# Patient Record
Sex: Male | Born: 1985 | Race: Black or African American | Hispanic: No | Marital: Single
Health system: Southern US, Community
[De-identification: ages and names within clinical notes are randomized; demographics above are authoritative.]

## PROBLEM LIST (undated history)

## (undated) DIAGNOSIS — Z789 Other specified health status: Secondary | ICD-10-CM

## (undated) DIAGNOSIS — T07XXXA Unspecified multiple injuries, initial encounter: Secondary | ICD-10-CM

## (undated) DIAGNOSIS — S02609A Fracture of mandible, unspecified, initial encounter for closed fracture: Secondary | ICD-10-CM

## (undated) DIAGNOSIS — W3400XA Accidental discharge from unspecified firearms or gun, initial encounter: Secondary | ICD-10-CM

## (undated) HISTORY — PX: NECK SURGERY: SHX720

---
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
## 2004-05-16 HISTORY — PX: OTHER SURGICAL HISTORY: SHX169

## 2004-05-31 ENCOUNTER — Emergency Department (HOSPITAL_COMMUNITY): Admission: EM | Admit: 2004-05-31 | Discharge: 2004-05-31 | Payer: Self-pay | Admitting: Emergency Medicine

## 2004-06-26 ENCOUNTER — Inpatient Hospital Stay (HOSPITAL_COMMUNITY): Admission: AC | Admit: 2004-06-26 | Discharge: 2004-07-01 | Payer: Self-pay

## 2004-08-19 ENCOUNTER — Emergency Department (HOSPITAL_COMMUNITY): Admission: EM | Admit: 2004-08-19 | Discharge: 2004-08-19 | Payer: Self-pay | Admitting: Emergency Medicine

## 2004-09-04 ENCOUNTER — Emergency Department (HOSPITAL_COMMUNITY): Admission: EM | Admit: 2004-09-04 | Discharge: 2004-09-04 | Payer: Self-pay | Admitting: Emergency Medicine

## 2006-11-13 ENCOUNTER — Emergency Department (HOSPITAL_COMMUNITY): Admission: EM | Admit: 2006-11-13 | Discharge: 2006-11-13 | Payer: Self-pay | Admitting: Emergency Medicine

## 2007-01-27 ENCOUNTER — Emergency Department (HOSPITAL_COMMUNITY): Admission: EM | Admit: 2007-01-27 | Discharge: 2007-01-27 | Payer: Self-pay | Admitting: Emergency Medicine

## 2007-08-26 ENCOUNTER — Emergency Department (HOSPITAL_COMMUNITY): Admission: EM | Admit: 2007-08-26 | Discharge: 2007-08-26 | Payer: Self-pay | Admitting: Emergency Medicine

## 2008-01-02 ENCOUNTER — Emergency Department (HOSPITAL_COMMUNITY): Admission: EM | Admit: 2008-01-02 | Discharge: 2008-01-02 | Payer: Self-pay | Admitting: Emergency Medicine

## 2008-09-18 ENCOUNTER — Inpatient Hospital Stay (HOSPITAL_COMMUNITY): Admission: AC | Admit: 2008-09-18 | Discharge: 2008-09-18 | Payer: Self-pay

## 2010-08-24 LAB — TYPE AND SCREEN: ABO/RH(D): A POS

## 2010-08-24 LAB — ABO/RH: ABO/RH(D): A POS
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
## 2011-02-15 ENCOUNTER — Emergency Department (HOSPITAL_COMMUNITY): Payer: Self-pay

## 2011-02-15 ENCOUNTER — Emergency Department (HOSPITAL_COMMUNITY)
Admission: EM | Admit: 2011-02-15 | Discharge: 2011-02-15 | Disposition: A | Discharge status: Home / Self Care | Payer: Self-pay | Attending: Emergency Medicine | Admitting: Emergency Medicine

## 2011-02-15 DIAGNOSIS — J3489 Other specified disorders of nose and nasal sinuses: Secondary | ICD-10-CM | POA: Insufficient documentation

## 2011-02-15 DIAGNOSIS — R059 Cough, unspecified: Secondary | ICD-10-CM | POA: Insufficient documentation

## 2011-02-15 DIAGNOSIS — R05 Cough: Secondary | ICD-10-CM | POA: Insufficient documentation

## 2011-02-15 DIAGNOSIS — R61 Generalized hyperhidrosis: Secondary | ICD-10-CM | POA: Insufficient documentation

## 2011-02-15 DIAGNOSIS — J40 Bronchitis, not specified as acute or chronic: Secondary | ICD-10-CM | POA: Insufficient documentation

## 2011-06-23 ENCOUNTER — Emergency Department (HOSPITAL_COMMUNITY)
Admission: EM | Admit: 2011-06-23 | Discharge: 2011-06-24 | Disposition: A | Discharge status: Home / Self Care | Payer: Self-pay | Attending: Emergency Medicine | Admitting: Emergency Medicine

## 2011-06-23 DIAGNOSIS — M79676 Pain in unspecified toe(s): Secondary | ICD-10-CM

## 2011-06-23 DIAGNOSIS — B351 Tinea unguium: Secondary | ICD-10-CM | POA: Insufficient documentation

## 2011-06-23 DIAGNOSIS — M79609 Pain in unspecified limb: Secondary | ICD-10-CM | POA: Insufficient documentation

## 2011-06-23 HISTORY — DX: Unspecified multiple injuries, initial encounter: T07.XXXA

## 2011-06-23 HISTORY — DX: Accidental discharge from unspecified firearms or gun, initial encounter: W34.00XA

## 2011-06-24 ENCOUNTER — Encounter (HOSPITAL_COMMUNITY): Payer: Self-pay | Admitting: *Deleted

## 2011-06-24 MED ORDER — TERBINAFINE HCL 250 MG PO TABS: 250.0000 mg | ORAL_TABLET | Freq: Every day | ORAL | Status: DC

## 2011-06-24 NOTE — ED Notes (Signed)
Pt c/o pain around nail of left great toe.  Nail is starting to come off.

## 2011-06-24 NOTE — ED Provider Notes (Signed)
Medical screening examination/treatment/procedure(s) were performed by non-physician practitioner and as supervising physician I was immediately available for consultation/collaboration.  Nicholes Stairs, MD 06/24/11 [phone]
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
## 2011-06-24 NOTE — ED Notes (Signed)
C/o L 1st toe pain, onset last night, worse today, r/t toe nail coming off, mentions toe nail fungus. Denies fever or other sx.

## 2012-01-21 ENCOUNTER — Encounter (HOSPITAL_COMMUNITY): Payer: Self-pay | Admitting: Physical Medicine and Rehabilitation

## 2012-01-21 ENCOUNTER — Emergency Department (HOSPITAL_COMMUNITY)
Admission: EM | Admit: 2012-01-21 | Discharge: 2012-01-21 | Disposition: A | Discharge status: Home / Self Care | Payer: Self-pay | Attending: Emergency Medicine | Admitting: Emergency Medicine

## 2012-01-21 DIAGNOSIS — L0201 Cutaneous abscess of face: Secondary | ICD-10-CM | POA: Insufficient documentation

## 2012-01-21 DIAGNOSIS — L03211 Cellulitis of face: Secondary | ICD-10-CM | POA: Insufficient documentation

## 2012-01-21 DIAGNOSIS — F17200 Nicotine dependence, unspecified, uncomplicated: Secondary | ICD-10-CM | POA: Insufficient documentation

## 2012-01-21 MED ORDER — SULFAMETHOXAZOLE-TRIMETHOPRIM 800-160 MG PO TABS: 1.0000 | ORAL_TABLET | Freq: Two times a day (BID) | ORAL | Status: AC

## 2012-01-21 MED ORDER — HYDROCODONE-ACETAMINOPHEN 5-500 MG PO TABS: 1.0000 | ORAL_TABLET | Freq: Four times a day (QID) | ORAL | Status: AC | PRN

## 2012-01-21 MED ORDER — CEPHALEXIN 500 MG PO CAPS: 500.0000 mg | ORAL_CAPSULE | Freq: Four times a day (QID) | ORAL | Status: AC
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
## 2012-01-21 NOTE — ED Notes (Signed)
Pt presents to department for evaluation of L sided facial abscess. Ongoing x4 days. Pt states area is raised, swollen and painful to touch. White colored drainage noted at site. 8/10 pain at the time. No signs of distress noted.

## 2012-04-30 ENCOUNTER — Emergency Department (HOSPITAL_COMMUNITY)
Admission: EM | Admit: 2012-04-30 | Discharge: 2012-04-30 | Disposition: A | Discharge status: Home / Self Care | Payer: Self-pay | Attending: Emergency Medicine | Admitting: Emergency Medicine

## 2012-04-30 ENCOUNTER — Encounter (HOSPITAL_COMMUNITY): Payer: Self-pay | Admitting: Emergency Medicine

## 2012-04-30 DIAGNOSIS — N498 Inflammatory disorders of other specified male genital organs: Secondary | ICD-10-CM | POA: Insufficient documentation

## 2012-04-30 DIAGNOSIS — Z87828 Personal history of other (healed) physical injury and trauma: Secondary | ICD-10-CM | POA: Insufficient documentation

## 2012-04-30 DIAGNOSIS — F17200 Nicotine dependence, unspecified, uncomplicated: Secondary | ICD-10-CM | POA: Insufficient documentation

## 2012-04-30 DIAGNOSIS — L0291 Cutaneous abscess, unspecified: Secondary | ICD-10-CM

## 2012-04-30 MED ORDER — HYDROCODONE-ACETAMINOPHEN 5-325 MG PO TABS: 1.0000 | ORAL_TABLET | ORAL | Status: DC | PRN

## 2012-04-30 MED ORDER — SULFAMETHOXAZOLE-TRIMETHOPRIM 800-160 MG PO TABS: 1.0000 | ORAL_TABLET | Freq: Two times a day (BID) | ORAL | Status: DC

## 2012-04-30 MED ORDER — CEPHALEXIN 500 MG PO CAPS: 500.0000 mg | ORAL_CAPSULE | Freq: Four times a day (QID) | ORAL | Status: DC

## 2012-04-30 MED ORDER — OXYCODONE-ACETAMINOPHEN 5-325 MG PO TABS
1.0000 | ORAL_TABLET | Freq: Once | ORAL | Status: AC
  Administered 2012-04-30: 1 via ORAL
  Filled 2012-04-30: qty 1

## 2012-04-30 NOTE — ED Notes (Signed)
Patient states that he has an painful groin, patient states there is a knot there.  Ibuprofen not helping with pain.

## 2012-04-30 NOTE — ED Provider Notes (Signed)
  Medical screening examination/treatment/procedure(s) were performed by non-physician practitioner and as supervising physician I was immediately available for consultation/collaboration.    Georganna Maxson D Shakira Los, MD 04/30/12 0719 
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
## 2012-05-19 ENCOUNTER — Emergency Department (HOSPITAL_COMMUNITY)
Admission: EM | Admit: 2012-05-19 | Discharge: 2012-05-19 | Disposition: A | Discharge status: Home / Self Care | Payer: Self-pay | Attending: Emergency Medicine | Admitting: Emergency Medicine

## 2012-05-19 ENCOUNTER — Encounter (HOSPITAL_COMMUNITY): Payer: Self-pay | Admitting: *Deleted

## 2012-05-19 DIAGNOSIS — R059 Cough, unspecified: Secondary | ICD-10-CM | POA: Insufficient documentation

## 2012-05-19 DIAGNOSIS — IMO0001 Reserved for inherently not codable concepts without codable children: Secondary | ICD-10-CM | POA: Insufficient documentation

## 2012-05-19 DIAGNOSIS — F17200 Nicotine dependence, unspecified, uncomplicated: Secondary | ICD-10-CM | POA: Insufficient documentation

## 2012-05-19 DIAGNOSIS — R05 Cough: Secondary | ICD-10-CM | POA: Insufficient documentation

## 2012-05-19 DIAGNOSIS — R110 Nausea: Secondary | ICD-10-CM | POA: Insufficient documentation

## 2012-05-19 DIAGNOSIS — B9789 Other viral agents as the cause of diseases classified elsewhere: Secondary | ICD-10-CM | POA: Insufficient documentation

## 2012-05-19 DIAGNOSIS — B349 Viral infection, unspecified: Secondary | ICD-10-CM

## 2012-05-19 DIAGNOSIS — R197 Diarrhea, unspecified: Secondary | ICD-10-CM | POA: Insufficient documentation

## 2012-05-19 DIAGNOSIS — J029 Acute pharyngitis, unspecified: Secondary | ICD-10-CM | POA: Insufficient documentation

## 2012-05-19 MED ORDER — ACETAMINOPHEN 325 MG PO TABS
650.0000 mg | ORAL_TABLET | Freq: Four times a day (QID) | ORAL | Status: DC | PRN
  Administered 2012-05-19: 650 mg via ORAL
  Filled 2012-05-19 (×2): qty 2

## 2012-05-19 MED ORDER — SODIUM CHLORIDE 0.9 % IV BOLUS (SEPSIS)
1000.0000 mL | Freq: Once | INTRAVENOUS | Status: AC
  Administered 2012-05-19: 1000 mL via INTRAVENOUS

## 2012-05-19 MED ORDER — HYDROCOD POLST-CHLORPHEN POLST 10-8 MG/5ML PO LQCR
5.0000 mL | Freq: Once | ORAL | Status: AC
  Administered 2012-05-19: 5 mL via ORAL
  Filled 2012-05-19: qty 5

## 2012-05-19 MED ORDER — OSELTAMIVIR PHOSPHATE 75 MG PO CAPS: 75.0000 mg | ORAL_CAPSULE | Freq: Two times a day (BID) | ORAL | Status: DC

## 2012-05-19 MED ORDER — ONDANSETRON HCL 4 MG/2ML IJ SOLN
4.0000 mg | Freq: Once | INTRAMUSCULAR | Status: AC
  Administered 2012-05-19: 4 mg via INTRAVENOUS
  Filled 2012-05-19: qty 2

## 2012-05-19 MED ORDER — HYDROCOD POLST-CHLORPHEN POLST 10-8 MG/5ML PO LQCR: 5.0000 mL | Freq: Two times a day (BID) | ORAL | Status: DC

## 2012-05-19 NOTE — ED Notes (Signed)
PT A.O. X 4. Reports cough x 6 days. Generalized body aches, fever at home greater than 103, and Nausea. Reports feeling hot. Skin warm and dry. NAD.  Denies SOB. Denies. Ambulatory with no assistance. Family at bedside.

## 2012-05-19 NOTE — ED Notes (Signed)
Pt ambulatory to restroom

## 2012-05-19 NOTE — ED Provider Notes (Signed)
Medical screening examination/treatment/procedure(s) were performed by non-physician practitioner and as supervising physician I was immediately available for consultation/collaboration.   Lyanne Co, MD 05/19/12 [phone]

## 2012-05-19 NOTE — ED Notes (Signed)
Pt c/o fever, cough, diarrhea, and body aches since last night.
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
## 2012-05-19 NOTE — ED Notes (Signed)
Pt A.O. X 4. Reports general body aches 3/10. Non productive cough present. Vitals stable. Verbalized importance of medication admin. Verbalized need to follow up with PCP. Verbalized need to seek immediate treatment. Wife at bedside. Ambulatory with no assistance. No further needs at this time.

## 2013-01-09 ENCOUNTER — Emergency Department (HOSPITAL_COMMUNITY): Payer: Self-pay

## 2013-01-09 ENCOUNTER — Encounter (HOSPITAL_COMMUNITY): Payer: Self-pay | Admitting: *Deleted

## 2013-01-09 ENCOUNTER — Emergency Department (HOSPITAL_COMMUNITY)
Admission: EM | Admit: 2013-01-09 | Discharge: 2013-01-09 | Disposition: A | Discharge status: Home / Self Care | Payer: Self-pay | Attending: Emergency Medicine | Admitting: Emergency Medicine

## 2013-01-09 DIAGNOSIS — Z87828 Personal history of other (healed) physical injury and trauma: Secondary | ICD-10-CM | POA: Insufficient documentation

## 2013-01-09 DIAGNOSIS — N498 Inflammatory disorders of other specified male genital organs: Secondary | ICD-10-CM | POA: Insufficient documentation

## 2013-01-09 DIAGNOSIS — Z202 Contact with and (suspected) exposure to infections with a predominantly sexual mode of transmission: Secondary | ICD-10-CM | POA: Insufficient documentation

## 2013-01-09 DIAGNOSIS — N492 Inflammatory disorders of scrotum: Secondary | ICD-10-CM

## 2013-01-09 DIAGNOSIS — F17200 Nicotine dependence, unspecified, uncomplicated: Secondary | ICD-10-CM | POA: Insufficient documentation

## 2013-01-09 DIAGNOSIS — Z711 Person with feared health complaint in whom no diagnosis is made: Secondary | ICD-10-CM

## 2013-01-09 LAB — URINALYSIS, ROUTINE W REFLEX MICROSCOPIC
Glucose, UA: NEGATIVE mg/dL
Ketones, ur: NEGATIVE mg/dL
Leukocytes, UA: NEGATIVE
pH: 5.5 (ref 5.0–8.0)

## 2013-01-09 LAB — POCT I-STAT, CHEM 8
Chloride: 102 mEq/L (ref 96–112)
Creatinine, Ser: 1 mg/dL (ref 0.50–1.35)
Glucose, Bld: 77 mg/dL (ref 70–99)
Potassium: 3.5 mEq/L (ref 3.5–5.1)

## 2013-01-09 LAB — CBC
HCT: 42 % (ref 39.0–52.0)
Hemoglobin: 14.9 g/dL (ref 13.0–17.0)
WBC: 7.9 10*3/uL (ref 4.0–10.5)

## 2013-01-09 MED ORDER — CLINDAMYCIN HCL 150 MG PO CAPS: 450.0000 mg | ORAL_CAPSULE | Freq: Three times a day (TID) | ORAL | Status: DC

## 2013-01-09 MED ORDER — SODIUM CHLORIDE 0.9 % IV SOLN
Freq: Once | INTRAVENOUS | Status: AC
  Administered 2013-01-09: 20:00:00 via INTRAVENOUS

## 2013-01-09 MED ORDER — LIDOCAINE HCL (PF) 1 % IJ SOLN
INTRAMUSCULAR | Status: AC
  Administered 2013-01-09: 2 mL
  Filled 2013-01-09: qty 5

## 2013-01-09 MED ORDER — CLINDAMYCIN PHOSPHATE 600 MG/50ML IV SOLN
600.0000 mg | Freq: Once | INTRAVENOUS | Status: AC
  Administered 2013-01-09: 600 mg via INTRAVENOUS
  Filled 2013-01-09: qty 50

## 2013-01-09 MED ORDER — CEFTRIAXONE SODIUM 250 MG IJ SOLR
250.0000 mg | Freq: Once | INTRAMUSCULAR | Status: AC
  Administered 2013-01-09: 250 mg via INTRAMUSCULAR
  Filled 2013-01-09: qty 250

## 2013-01-09 MED ORDER — AZITHROMYCIN 250 MG PO TABS
1000.0000 mg | ORAL_TABLET | Freq: Once | ORAL | Status: AC
  Administered 2013-01-09: 1000 mg via ORAL
  Filled 2013-01-09: qty 4
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
## 2013-01-09 NOTE — ED Notes (Signed)
Pt in requesting STD check due to partner reporting symptoms, pt denies symptoms

## 2013-01-10 LAB — RPR: RPR Ser Ql: NONREACTIVE

## 2013-01-10 NOTE — ED Provider Notes (Signed)
Medical screening examination/treatment/procedure(s) were performed by non-physician practitioner and as supervising physician I was immediately available for consultation/collaboration.  Shon Baton, MD 01/10/13 [phone]

## 2013-03-13 ENCOUNTER — Encounter (HOSPITAL_COMMUNITY): Payer: Self-pay | Admitting: Emergency Medicine

## 2013-03-13 ENCOUNTER — Emergency Department (HOSPITAL_COMMUNITY): Payer: Self-pay

## 2013-03-13 ENCOUNTER — Emergency Department (HOSPITAL_COMMUNITY)
Admission: EM | Admit: 2013-03-13 | Discharge: 2013-03-13 | Disposition: A | Discharge status: Home / Self Care | Payer: Self-pay | Attending: Emergency Medicine | Admitting: Emergency Medicine

## 2013-03-13 DIAGNOSIS — F17200 Nicotine dependence, unspecified, uncomplicated: Secondary | ICD-10-CM | POA: Insufficient documentation

## 2013-03-13 DIAGNOSIS — S02600B Fracture of unspecified part of body of mandible, initial encounter for open fracture: Secondary | ICD-10-CM

## 2013-03-13 DIAGNOSIS — S02650B Fracture of angle of mandible, unspecified side, initial encounter for open fracture: Secondary | ICD-10-CM | POA: Insufficient documentation

## 2013-03-13 DIAGNOSIS — S0280XA Fracture of other specified skull and facial bones, unspecified side, initial encounter for closed fracture: Secondary | ICD-10-CM | POA: Insufficient documentation

## 2013-03-13 DIAGNOSIS — Z23 Encounter for immunization: Secondary | ICD-10-CM | POA: Insufficient documentation

## 2013-03-13 DIAGNOSIS — F1010 Alcohol abuse, uncomplicated: Secondary | ICD-10-CM | POA: Insufficient documentation

## 2013-03-13 DIAGNOSIS — S01501A Unspecified open wound of lip, initial encounter: Secondary | ICD-10-CM | POA: Insufficient documentation

## 2013-03-13 DIAGNOSIS — S02609B Fracture of mandible, unspecified, initial encounter for open fracture: Secondary | ICD-10-CM

## 2013-03-13 LAB — CBC WITH DIFFERENTIAL/PLATELET
Basophils Relative: 0 % (ref 0–1)
Eosinophils Absolute: 0 10*3/uL (ref 0.0–0.7)
HCT: 44.4 % (ref 39.0–52.0)
Hemoglobin: 15.6 g/dL (ref 13.0–17.0)
MCH: 31 pg (ref 26.0–34.0)
MCHC: 35.1 g/dL (ref 30.0–36.0)
Monocytes Absolute: 1.3 10*3/uL — ABNORMAL HIGH (ref 0.1–1.0)
Monocytes Relative: 9 % (ref 3–12)

## 2013-03-13 LAB — BASIC METABOLIC PANEL
BUN: 14 mg/dL (ref 6–23)
Chloride: 97 mEq/L (ref 96–112)
Creatinine, Ser: 0.86 mg/dL (ref 0.50–1.35)
GFR calc Af Amer: >90 mL/min (ref >90)
GFR calc non Af Amer: >90 mL/min (ref >90)

## 2013-03-13 MED ORDER — AMOXICILLIN-POT CLAVULANATE 500-125 MG PO TABS: 1.0000 | ORAL_TABLET | Freq: Three times a day (TID) | ORAL | Status: DC

## 2013-03-13 MED ORDER — SODIUM CHLORIDE 0.9 % IV SOLN
1000.0000 mL | Freq: Once | INTRAVENOUS | Status: AC
  Administered 2013-03-13: 1000 mL via INTRAVENOUS

## 2013-03-13 MED ORDER — TETANUS-DIPHTH-ACELL PERTUSSIS 5-2.5-18.5 LF-MCG/0.5 IM SUSP
0.5000 mL | Freq: Once | INTRAMUSCULAR | Status: AC
  Administered 2013-03-13: 0.5 mL via INTRAMUSCULAR
  Filled 2013-03-13: qty 0.5

## 2013-03-13 MED ORDER — HYDROMORPHONE HCL PF 1 MG/ML IJ SOLN
1.0000 mg | Freq: Once | INTRAMUSCULAR | Status: AC
  Administered 2013-03-13: 1 mg via INTRAVENOUS
  Filled 2013-03-13: qty 1

## 2013-03-13 MED ORDER — HYDROMORPHONE HCL PF 1 MG/ML IJ SOLN
1.0000 mg | Freq: Once | INTRAMUSCULAR | Status: AC
  Administered 2013-03-13: 1 mg via INTRAVENOUS

## 2013-03-13 MED ORDER — HYDROMORPHONE HCL PF 1 MG/ML IJ SOLN
INTRAMUSCULAR | Status: AC
  Filled 2013-03-13: qty 1

## 2013-03-13 MED ORDER — SODIUM CHLORIDE 0.9 % IV SOLN
3.0000 g | Freq: Once | INTRAVENOUS | Status: AC
  Administered 2013-03-13: 3 g via INTRAVENOUS
  Filled 2013-03-13: qty 3

## 2013-03-13 MED ORDER — IBUPROFEN 600 MG PO TABS
600.0000 mg | ORAL_TABLET | Freq: Three times a day (TID) | ORAL | Status: DC | PRN
Start: 2013-03-13 — End: 2013-04-28

## 2013-03-13 MED ORDER — OXYCODONE-ACETAMINOPHEN 5-325 MG PO TABS: 1.0000 | ORAL_TABLET | ORAL | Status: DC | PRN

## 2013-03-13 MED ORDER — SODIUM CHLORIDE 0.9 % IV SOLN
1000.0000 mL | INTRAVENOUS | Status: DC
  Administered 2013-03-13: 1000 mL via INTRAVENOUS

## 2013-03-13 MED ORDER — AMOXICILLIN-POT CLAVULANATE 400-57 MG/5ML PO SUSR: 400.0000 mg | Freq: Three times a day (TID) | ORAL | Status: DC

## 2013-03-13 NOTE — ED Provider Notes (Signed)
I personally saw and evaluated this patient and provided his care.  My physician assistant performed a laceration repair.  Please see documentation.  Lyanne Co, MD 03/13/13 [id number]

## 2013-03-13 NOTE — ED Provider Notes (Signed)
I was asked by Dr. Patria Mane to repair lip laceration.  LACERATION REPAIR Performed by: Roxy Horseman Authorized by: Roxy Horseman Consent: Verbal consent obtained. Risks and benefits: risks, benefits and alternatives were discussed Consent given by: patient Patient identity confirmed: provided demographic data Prepped and Draped in normal sterile fashion Wound explored  Laceration Location: left lower lip  Laceration Length: 2cm  No Foreign Bodies seen or palpated  Anesthesia: local infiltration  Local anesthetic: lidocaine 2% without epinephrine  Anesthetic total: 3 ml  Irrigation method: syringe Amount of cleaning: standard  Skin closure: 5-0 vicryl  Number of sutures: 4  Technique: interrupted  Patient tolerance: Patient tolerated the procedure well with no immediate complications.   Roxy Horseman, PA-C 03/13/13 [phone]
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
## 2013-03-13 NOTE — ED Notes (Signed)
Ice pak applied to pt.s lt. Facial area.lt. Side of face is swollen , lt. Eye is swollen shut.  Airway patent
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
## 2013-03-13 NOTE — ED Notes (Signed)
Plastic surgeon was into see pt.  Pt. Was given ice chips and mouth swabs per Surgeon.

## 2013-03-13 NOTE — ED Notes (Signed)
[place] collar placed on per order from EDP

## 2013-03-15 ENCOUNTER — Encounter (HOSPITAL_COMMUNITY): Payer: Self-pay | Admitting: Pharmacy Technician

## 2013-03-15 ENCOUNTER — Other Ambulatory Visit: Payer: Self-pay | Admitting: Plastic Surgery

## 2013-03-15 DIAGNOSIS — S02609D Fracture of mandible, unspecified, subsequent encounter for fracture with routine healing: Secondary | ICD-10-CM

## 2013-03-19 ENCOUNTER — Other Ambulatory Visit: Payer: Self-pay | Admitting: Plastic Surgery

## 2013-03-19 ENCOUNTER — Encounter (HOSPITAL_COMMUNITY): Payer: Self-pay | Admitting: *Deleted

## 2013-03-19 DIAGNOSIS — S02609D Fracture of mandible, unspecified, subsequent encounter for fracture with routine healing: Secondary | ICD-10-CM

## 2013-03-19 MED ORDER — CEFAZOLIN SODIUM-DEXTROSE 2-3 GM-% IV SOLR
2.0000 g | INTRAVENOUS | Status: AC
  Administered 2013-03-20: 2 g via INTRAVENOUS
  Filled 2013-03-19: qty 50

## 2013-03-20 ENCOUNTER — Encounter (HOSPITAL_COMMUNITY)
Admission: RE | Disposition: A | Discharge status: Home / Self Care | Payer: Self-pay | Source: Ambulatory Visit | Attending: Plastic Surgery

## 2013-03-20 ENCOUNTER — Ambulatory Visit (HOSPITAL_COMMUNITY): Payer: Self-pay | Admitting: Anesthesiology

## 2013-03-20 ENCOUNTER — Encounter (HOSPITAL_COMMUNITY): Payer: Self-pay | Admitting: Plastic Surgery

## 2013-03-20 ENCOUNTER — Ambulatory Visit (HOSPITAL_COMMUNITY)
Admission: RE | Admit: 2013-03-20 | Discharge: 2013-03-20 | Disposition: A | Discharge status: Home / Self Care | Payer: Self-pay | Source: Ambulatory Visit | Attending: Plastic Surgery | Admitting: Plastic Surgery

## 2013-03-20 ENCOUNTER — Encounter (HOSPITAL_COMMUNITY): Payer: Self-pay | Admitting: Anesthesiology

## 2013-03-20 DIAGNOSIS — S02609A Fracture of mandible, unspecified, initial encounter for closed fracture: Secondary | ICD-10-CM | POA: Insufficient documentation

## 2013-03-20 DIAGNOSIS — S02609D Fracture of mandible, unspecified, subsequent encounter for fracture with routine healing: Secondary | ICD-10-CM

## 2013-03-20 DIAGNOSIS — X58XXXA Exposure to other specified factors, initial encounter: Secondary | ICD-10-CM | POA: Insufficient documentation

## 2013-03-20 HISTORY — PX: ORIF MANDIBULAR FRACTURE: SHX2127

## 2013-03-20 SURGERY — OPEN REDUCTION INTERNAL FIXATION (ORIF) MANDIBULAR FRACTURE
Anesthesia: General | Site: Face | Laterality: Bilateral | Wound class: Clean

## 2013-03-20 MED ORDER — LIDOCAINE-EPINEPHRINE 1 %-1:100000 IJ SOLN
INTRAMUSCULAR | Status: DC | PRN
  Administered 2013-03-20: 9 mL

## 2013-03-20 MED ORDER — SUCCINYLCHOLINE CHLORIDE 20 MG/ML IJ SOLN
INTRAMUSCULAR | Status: DC | PRN
  Administered 2013-03-20: 140 mg via INTRAVENOUS

## 2013-03-20 MED ORDER — CHLORHEXIDINE GLUCONATE 0.12 % MT SOLN: 15.0000 mL | Freq: Two times a day (BID) | OROMUCOSAL | Status: DC

## 2013-03-20 MED ORDER — LACTATED RINGERS IV SOLN
INTRAVENOUS | Status: DC
  Administered 2013-03-20: 12:00:00 via INTRAVENOUS

## 2013-03-20 MED ORDER — FENTANYL CITRATE 0.05 MG/ML IJ SOLN
50.0000 ug | INTRAMUSCULAR | Status: DC | PRN
  Administered 2013-03-20: 50 ug via INTRAVENOUS

## 2013-03-20 MED ORDER — MIDAZOLAM HCL 5 MG/5ML IJ SOLN
INTRAMUSCULAR | Status: DC | PRN
  Administered 2013-03-20: 2 mg via INTRAVENOUS

## 2013-03-20 MED ORDER — HYDROMORPHONE HCL PF 1 MG/ML IJ SOLN
INTRAMUSCULAR | Status: AC
  Administered 2013-03-20: 0.5 mg via INTRAVENOUS
  Filled 2013-03-20: qty 1

## 2013-03-20 MED ORDER — CEFAZOLIN SODIUM-DEXTROSE 2-3 GM-% IV SOLR: 2.0000 g | INTRAVENOUS | Status: DC

## 2013-03-20 MED ORDER — ONDANSETRON HCL 4 MG/2ML IJ SOLN
INTRAMUSCULAR | Status: DC | PRN
  Administered 2013-03-20: 4 mg via INTRAVENOUS

## 2013-03-20 MED ORDER — HYDROMORPHONE HCL PF 1 MG/ML IJ SOLN
0.2500 mg | INTRAMUSCULAR | Status: DC | PRN
  Administered 2013-03-20 (×4): 0.5 mg via INTRAVENOUS

## 2013-03-20 MED ORDER — 0.9 % SODIUM CHLORIDE (POUR BTL) OPTIME
TOPICAL | Status: DC | PRN
  Administered 2013-03-20: 1000 mL

## 2013-03-20 MED ORDER — OXYCODONE HCL 5 MG/5ML PO SOLN: 5.0000 mg | Freq: Once | ORAL | Status: AC | PRN

## 2013-03-20 MED ORDER — OXYCODONE HCL 5 MG PO TABS: 5.0000 mg | ORAL_TABLET | Freq: Once | ORAL | Status: AC | PRN

## 2013-03-20 MED ORDER — PROPOFOL 10 MG/ML IV BOLUS
INTRAVENOUS | Status: DC | PRN
  Administered 2013-03-20: 130 mg via INTRAVENOUS

## 2013-03-20 MED ORDER — NEOSTIGMINE METHYLSULFATE 1 MG/ML IJ SOLN
INTRAMUSCULAR | Status: DC | PRN
  Administered 2013-03-20: 3 mg via INTRAVENOUS

## 2013-03-20 MED ORDER — ROCURONIUM BROMIDE 100 MG/10ML IV SOLN
INTRAVENOUS | Status: DC | PRN
  Administered 2013-03-20: 40 mg via INTRAVENOUS

## 2013-03-20 MED ORDER — FENTANYL CITRATE 0.05 MG/ML IJ SOLN
INTRAMUSCULAR | Status: DC | PRN
  Administered 2013-03-20: 50 ug via INTRAVENOUS
  Administered 2013-03-20 (×3): 100 ug via INTRAVENOUS
  Administered 2013-03-20: 50 ug via INTRAVENOUS

## 2013-03-20 MED ORDER — ONDANSETRON HCL 4 MG/2ML IJ SOLN
INTRAMUSCULAR | Status: AC
  Administered 2013-03-20: 4 mg via INTRAVENOUS
  Filled 2013-03-20: qty 2

## 2013-03-20 MED ORDER — LIDOCAINE HCL (CARDIAC) 20 MG/ML IV SOLN
INTRAVENOUS | Status: DC | PRN
  Administered 2013-03-20: 100 mg via INTRAVENOUS

## 2013-03-20 MED ORDER — MEPERIDINE HCL 25 MG/ML IJ SOLN: 6.2500 mg | INTRAMUSCULAR | Status: DC | PRN

## 2013-03-20 MED ORDER — LACTATED RINGERS IV SOLN
INTRAVENOUS | Status: DC | PRN
  Administered 2013-03-20 (×2): via INTRAVENOUS

## 2013-03-20 MED ORDER — GLYCOPYRROLATE 0.2 MG/ML IJ SOLN
INTRAMUSCULAR | Status: DC | PRN
  Administered 2013-03-20: 0.4 mg via INTRAVENOUS

## 2013-03-20 MED ORDER — ONDANSETRON HCL 4 MG/2ML IJ SOLN
4.0000 mg | Freq: Once | INTRAMUSCULAR | Status: AC | PRN
  Administered 2013-03-20: 4 mg via INTRAVENOUS

## 2013-03-20 MED ORDER — FENTANYL CITRATE 0.05 MG/ML IJ SOLN
INTRAMUSCULAR | Status: AC
  Filled 2013-03-20: qty 2

## 2013-03-20 SURGICAL SUPPLY — 52 items
24 gauge wire ×1 IMPLANT
BIT DRILL DEPTH MARK1.8X115 26 (MISCELLANEOUS) IMPLANT
BLADE SURG 15 STRL LF DISP TIS (BLADE) ×1 IMPLANT
BLADE SURG 15 STRL SS (BLADE)
BLADE SURG ROTATE 9660 (MISCELLANEOUS) IMPLANT
CANISTER SUCTION 2500CC (MISCELLANEOUS) ×2 IMPLANT
CLEANER TIP ELECTROSURG 2X2 (MISCELLANEOUS) ×2 IMPLANT
CLOTH BEACON ORANGE TIMEOUT ST (SAFETY) ×1 IMPLANT
COVER SURGICAL LIGHT HANDLE (MISCELLANEOUS) ×2 IMPLANT
DECANTER SPIKE VIAL GLASS SM (MISCELLANEOUS) ×1 IMPLANT
DRILL DEPTH MARK1.8X115 26 (MISCELLANEOUS) ×2
ELECT COATED BLADE 2.86 ST (ELECTRODE) ×2 IMPLANT
ELECT NDL BLADE 2-5/6 (NEEDLE) IMPLANT
ELECT NEEDLE BLADE 2-5/6 (NEEDLE) ×2 IMPLANT
ELECT REM PT RETURN 9FT ADLT (ELECTROSURGICAL) ×2
ELECTRODE REM PT RTRN 9FT ADLT (ELECTROSURGICAL) ×1 IMPLANT
GAUZE SPONGE 4X4 16PLY XRAY LF (GAUZE/BANDAGES/DRESSINGS) ×1 IMPLANT
GLOVE BIO SURGEON STRL SZ 6 (GLOVE) ×1 IMPLANT
GLOVE BIO SURGEON STRL SZ 6.5 (GLOVE) ×2 IMPLANT
GLOVE BIOGEL PI IND STRL 6.5 (GLOVE) IMPLANT
GLOVE BIOGEL PI INDICATOR 6.5 (GLOVE) ×1
GLOVE SURG SS PI 6.0 STRL IVOR (GLOVE) ×2 IMPLANT
GOWN STRL NON-REIN LRG LVL3 (GOWN DISPOSABLE) ×6 IMPLANT
KIT BASIN OR (CUSTOM PROCEDURE TRAY) ×2 IMPLANT
KIT ROOM TURNOVER OR (KITS) ×2 IMPLANT
NEEDLE 27GAX1X1/2 (NEEDLE) IMPLANT
NS IRRIG 1000ML POUR BTL (IV SOLUTION) ×2 IMPLANT
PAD ARMBOARD 7.5X6 YLW CONV (MISCELLANEOUS) ×4 IMPLANT
PENCIL BUTTON HOLSTER BLD 10FT (ELECTRODE) ×2 IMPLANT
PLATE LOCK 7H CRVD 1.6 (Plate) ×1 IMPLANT
SCISSORS WIRE ANG 4 3/4 DISP (INSTRUMENTS) ×2 IMPLANT
SCREW LOCKING X-DR 2.3X12 (Screw) ×3 IMPLANT
SCREW LOCKING X-DR 2.3X14 (Screw) ×2 IMPLANT
SCREW SD IMF HEX 2.0X7 (Screw) ×4 IMPLANT
SCREW SD IMF HEX 2.0X9 (Screw) ×2 IMPLANT
SOLUTION BETADINE 4OZ (MISCELLANEOUS) ×1 IMPLANT
SUT ETHILON 4 0 CL P 3 (SUTURE) IMPLANT
SUT MON AB 3-0 SH 27 (SUTURE)
SUT MON AB 3-0 SH27 (SUTURE) ×2 IMPLANT
SUT PROLENE 6 0 PC 1 (SUTURE) IMPLANT
SUT STEEL 0 (SUTURE)
SUT STEEL 0 18XMFL TIE 17 (SUTURE) IMPLANT
SUT STEEL 1 (SUTURE) ×1 IMPLANT
SUT STEEL 2 (SUTURE) ×1 IMPLANT
SUT STEEL 4 (SUTURE) ×1 IMPLANT
SUT VICRYL 4-0 PS2 18IN ABS (SUTURE) IMPLANT
TOOTHBRUSH ADULT (PERSONAL CARE ITEMS) ×1 IMPLANT
TOWEL OR 17X24 6PK STRL BLUE (TOWEL DISPOSABLE) ×2 IMPLANT
TOWEL OR 17X26 10 PK STRL BLUE (TOWEL DISPOSABLE) ×2 IMPLANT
TRAY ENT MC OR (CUSTOM PROCEDURE TRAY) ×2 IMPLANT
TRAY FOLEY CATH 14FRSI W/METER (CATHETERS) IMPLANT
WATER STERILE IRR 1000ML POUR (IV SOLUTION) IMPLANT

## 2013-03-20 NOTE — Op Note (Addendum)
Operative Note   DATE OF OPERATION: [id number]  SURGICAL DIVISION: Plastic Surgery  PREOPERATIVE DIAGNOSES:  Bilateral mandible fracture  POSTOPERATIVE DIAGNOSES:  same  PROCEDURE:  Open reduction internal fixation of right mandible fracture with maxillomandibular fixation  SURGEON: [name], DO  ASSISTANT: Glenna Fellows, MD  ANESTHESIA:  General.   COMPLICATIONS: None.   INDICATIONS FOR PROCEDURE:  The patient, Micheal Schmidt, is a 27 y.o. male born on 14-Jun-1985, is here for treatment of mandible fracture.   CONSENT:  Informed consent was obtained directly from the patient. Risks, benefits and alternatives were fully discussed. Specific risks including but not limited to bleeding, infection, hematoma, seroma, scarring, pain, implant infection, implant extrusion, capsular contracture, asymmetry, wound healing problems, and need for further surgery were all discussed. The patient did have an ample opportunity to have questions answered to satisfaction.   DESCRIPTION OF PROCEDURE:  The patient was taken to the operating room. SCDs were placed and IV antibiotics were given. The patient's operative site was prepped and draped in a sterile fashion. A time out was performed and all information was confirmed to be correct.  General anesthesia was administered via nasal intubation.  His occlusion was easily reduced to normal occlusion.  Lidocaine with epinepherine was injected into the buccal mucosa.  The IMF screws were placed with care not to violate the tooth roots in the upper and lower bone.  The wires were placed and tightened with excellent occlusion.  The right mandible buccal mucosa was opened with the bovie.  Hemostasis was achieved with electrocautery.  The titanium plate was selected and placed with two screws on either side of the fracture site.  The mucosa was closed with 3-0 Vicryl.  An NG was placed to remove the excess blood and fluid in the oral cavity. The patient was allowed to wake  from anesthesia, extubated and taken to the recovery room in satisfactory condition. He tolerated the procedure well.  There were no complications.  He was allowed to wake up and taken to the recovery room in stable condition.

## 2013-03-20 NOTE — Transfer of Care (Signed)
Immediate Anesthesia Transfer of Care Note  Patient: Micheal Schmidt  Procedure(s) Performed: Procedure(s): OPEN REDUCTION INTERNAL FIXATION (ORIF) BILATERAL MANDIBULAR FRACTURE WITH MAXILLOMANDIBULAR FIXATION (Bilateral)  Patient Location: PACU  Anesthesia Type:General  Level of Consciousness: awake and alert   Airway & Oxygen Therapy: Patient Spontanous Breathing and Patient connected to face mask oxygen  Post-op Assessment: Report given to PACU RN and Post -op Vital signs reviewed and stable  Post vital signs: Reviewed and stable  Complications: No apparent anesthesia complications

## 2013-03-20 NOTE — Preoperative (Signed)
Beta Blockers   Reason not to administer Beta Blockers:Not Applicable 

## 2013-03-20 NOTE — Interval H&P Note (Signed)
History and Physical Interval Note:  03/20/2013 2:03 PM  Micheal Schmidt  has presented today for surgery, with the diagnosis of MANDIBULAR FRACTURE  The various methods of treatment have been discussed with the patient and family. After consideration of risks, benefits and other options for treatment, the patient has consented to  Procedure(s): OPEN REDUCTION INTERNAL FIXATION (ORIF) BILATERAL MANDIBULAR FRACTURE (Bilateral) as a surgical intervention .  The patient's history has been reviewed, patient examined, no change in status, stable for surgery.  I have reviewed the patient's chart and labs.  Questions were answered to the patient's satisfaction.     SANGER,Maynard David

## 2013-03-20 NOTE — Interval H&P Note (Signed)
History and Physical Interval Note:  03/20/2013 6:28 AM  Micheal Schmidt  has presented today for surgery, with the diagnosis of MANDIBULAR FRACTURE  The various methods of treatment have been discussed with the patient and family. After consideration of risks, benefits and other options for treatment, the patient has consented to  Procedure(s): OPEN REDUCTION INTERNAL FIXATION (ORIF) BILATERAL MANDIBULAR FRACTURE (Bilateral) as a surgical intervention .  The patient's history has been reviewed, patient examined, no change in status, stable for surgery.  I have reviewed the patient's chart and labs.  Questions were answered to the patient's satisfaction.     SANGER,CLAIRE

## 2013-03-20 NOTE — Anesthesia Preprocedure Evaluation (Signed)
Anesthesia Evaluation  Patient identified by MRN, date of birth, ID band Patient awake, Patient confused and Patient unresponsive    Airway Mallampati: I TM Distance: >3 FB Neck ROM: full    Dental   Pulmonary          Cardiovascular     Neuro/Psych    GI/Hepatic   Endo/Other    Renal/GU      Musculoskeletal   Abdominal   Peds  Hematology   Anesthesia Other Findings   Reproductive/Obstetrics                           Anesthesia Physical Anesthesia Plan  ASA: II  Anesthesia Plan: General   Post-op Pain Management:    Induction:   Airway Management Planned:   Additional Equipment:   Intra-op Plan:   Post-operative Plan:   Informed Consent: I have reviewed the patients History and Physical, chart, labs and discussed the procedure including the risks, benefits and alternatives for the proposed anesthesia with the patient or authorized representative who has indicated his/her understanding and acceptance.     Plan Discussed with: Anesthesiologist and Surgeon  Anesthesia Plan Comments:         Anesthesia Quick Evaluation

## 2013-03-20 NOTE — Brief Op Note (Signed)
03/20/2013  4:12 PM  PATIENT:  Sudie Bailey  27 y.o. male  PRE-OPERATIVE DIAGNOSIS:  MANDIBULAR FRACTURE  POST-OPERATIVE DIAGNOSIS:  mandibular fracture  PROCEDURE:  Procedure(s): OPEN REDUCTION INTERNAL FIXATION (ORIF) BILATERAL MANDIBULAR FRACTURE WITH MAXILLOMANDIBULAR FIXATION (Bilateral)  SURGEON:  Surgeon(s) and Role:    * Sequan Auxier Sanger, DO - Primary    * Glenna Fellows, MD - Assisting  PHYSICIAN ASSISTANT: none  ASSISTANTS: Dr. Glenna Fellows   ANESTHESIA:   general  EBL:     BLOOD ADMINISTERED:none  DRAINS: none   LOCAL MEDICATIONS USED:  LIDOCAINE   SPECIMEN:  No Specimen  DISPOSITION OF SPECIMEN:  N/A  COUNTS:  YES  TOURNIQUET:  * No tourniquets in log *  DICTATION: .Dragon Dictation  PLAN OF CARE: Discharge to home after PACU  PATIENT DISPOSITION:  PACU - hemodynamically stable.   Delay start of Pharmacological VTE agent (>24hrs) due to surgical blood loss or risk of bleeding: no

## 2013-03-20 NOTE — Anesthesia Postprocedure Evaluation (Signed)
Anesthesia Post Note  Patient: Micheal Schmidt  Procedure(s) Performed: Procedure(s) (LRB): OPEN REDUCTION INTERNAL FIXATION (ORIF) BILATERAL MANDIBULAR FRACTURE WITH MAXILLOMANDIBULAR FIXATION (Bilateral)  Anesthesia type: general  Patient location: PACU  Post pain: Pain level controlled  Post assessment: Patient's Cardiovascular Status Stable  Last Vitals:  Filed Vitals:   03/20/13 1700  BP: 158/87  Pulse: 80  Temp:   Resp: 14    Post vital signs: Reviewed and stable  Level of consciousness: sedated  Complications: No apparent anesthesia complications
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
## 2013-03-22 ENCOUNTER — Encounter (HOSPITAL_COMMUNITY): Payer: Self-pay | Admitting: Plastic Surgery

## 2013-03-23 ENCOUNTER — Emergency Department (HOSPITAL_COMMUNITY)
Admission: EM | Admit: 2013-03-23 | Discharge: 2013-03-23 | Disposition: A | Discharge status: Home / Self Care | Payer: MEDICAID | Attending: Emergency Medicine | Admitting: Emergency Medicine

## 2013-03-23 ENCOUNTER — Encounter (HOSPITAL_COMMUNITY): Payer: Self-pay | Admitting: Emergency Medicine

## 2013-03-23 DIAGNOSIS — G479 Sleep disorder, unspecified: Secondary | ICD-10-CM | POA: Insufficient documentation

## 2013-03-23 DIAGNOSIS — J029 Acute pharyngitis, unspecified: Secondary | ICD-10-CM | POA: Insufficient documentation

## 2013-03-23 DIAGNOSIS — R51 Headache: Secondary | ICD-10-CM | POA: Insufficient documentation

## 2013-03-23 DIAGNOSIS — G8918 Other acute postprocedural pain: Secondary | ICD-10-CM | POA: Insufficient documentation

## 2013-03-23 DIAGNOSIS — F17200 Nicotine dependence, unspecified, uncomplicated: Secondary | ICD-10-CM | POA: Insufficient documentation

## 2013-03-23 DIAGNOSIS — Z872 Personal history of diseases of the skin and subcutaneous tissue: Secondary | ICD-10-CM | POA: Insufficient documentation

## 2013-03-23 DIAGNOSIS — M2550 Pain in unspecified joint: Secondary | ICD-10-CM | POA: Insufficient documentation

## 2013-03-23 DIAGNOSIS — Z792 Long term (current) use of antibiotics: Secondary | ICD-10-CM | POA: Insufficient documentation

## 2013-03-23 DIAGNOSIS — Z9889 Other specified postprocedural states: Secondary | ICD-10-CM | POA: Insufficient documentation

## 2013-03-23 DIAGNOSIS — R6884 Jaw pain: Secondary | ICD-10-CM | POA: Insufficient documentation

## 2013-03-23 DIAGNOSIS — Z79899 Other long term (current) drug therapy: Secondary | ICD-10-CM | POA: Insufficient documentation

## 2013-03-23 MED ORDER — HYDROMORPHONE HCL PF 1 MG/ML IJ SOLN
1.0000 mg | Freq: Once | INTRAMUSCULAR | Status: AC
  Administered 2013-03-23: 1 mg via INTRAMUSCULAR
  Filled 2013-03-23: qty 1

## 2013-03-23 MED ORDER — DEXAMETHASONE SODIUM PHOSPHATE 4 MG/ML IJ SOLN
4.0000 mg | Freq: Once | INTRAMUSCULAR | Status: AC
  Administered 2013-03-23: 4 mg via INTRAMUSCULAR
  Filled 2013-03-23: qty 1

## 2013-03-23 MED ORDER — HYDROCODONE-ACETAMINOPHEN 7.5-325 MG/15ML PO SOLN: 15.0000 mL | Freq: Four times a day (QID) | ORAL | Status: DC | PRN

## 2013-03-23 MED ORDER — DIPHENHYDRAMINE HCL 12.5 MG/5ML PO ELIX
25.0000 mg | ORAL_SOLUTION | Freq: Once | ORAL | Status: AC
  Administered 2013-03-23: 25 mg via ORAL
  Filled 2013-03-23: qty 10
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
## 2013-03-23 NOTE — ED Provider Notes (Signed)
Medical screening examination/treatment/procedure(s) were performed by non-physician practitioner and as supervising physician I was immediately available for consultation/collaboration.  [name], MD 03/23/13 1630

## 2013-03-23 NOTE — ED Notes (Addendum)
Pt reports he had surgery for fractured jaw on Wednesday and can not sleep due to pain. The meds they gave him for pain are not working and his lips have been swollen since he started to take the pain meds. Denies sob or trouble swallowing.

## 2013-04-17 ENCOUNTER — Ambulatory Visit (HOSPITAL_COMMUNITY)
Admission: RE | Admit: 2013-04-17 | Discharge: 2013-04-17 | Disposition: A | Discharge status: Home / Self Care | Payer: Self-pay | Source: Ambulatory Visit | Attending: Plastic Surgery | Admitting: Plastic Surgery

## 2013-04-17 ENCOUNTER — Other Ambulatory Visit (HOSPITAL_COMMUNITY): Payer: Self-pay | Admitting: Plastic Surgery

## 2013-04-17 DIAGNOSIS — T148XXA Other injury of unspecified body region, initial encounter: Secondary | ICD-10-CM

## 2013-04-17 DIAGNOSIS — X58XXXA Exposure to other specified factors, initial encounter: Secondary | ICD-10-CM | POA: Insufficient documentation

## 2013-04-17 DIAGNOSIS — IMO0001 Reserved for inherently not codable concepts without codable children: Secondary | ICD-10-CM | POA: Insufficient documentation

## 2013-04-17 DIAGNOSIS — S02609A Fracture of mandible, unspecified, initial encounter for closed fracture: Secondary | ICD-10-CM | POA: Insufficient documentation

## 2013-04-28 ENCOUNTER — Emergency Department (HOSPITAL_COMMUNITY): Payer: Self-pay

## 2013-04-28 ENCOUNTER — Encounter (HOSPITAL_COMMUNITY): Payer: Self-pay | Admitting: Emergency Medicine

## 2013-04-28 ENCOUNTER — Emergency Department (HOSPITAL_COMMUNITY)
Admission: EM | Admit: 2013-04-28 | Discharge: 2013-04-29 | Disposition: A | Discharge status: Home / Self Care | Payer: Self-pay | Attending: Emergency Medicine | Admitting: Emergency Medicine

## 2013-04-28 DIAGNOSIS — Y92410 Unspecified street and highway as the place of occurrence of the external cause: Secondary | ICD-10-CM | POA: Insufficient documentation

## 2013-04-28 DIAGNOSIS — M549 Dorsalgia, unspecified: Secondary | ICD-10-CM

## 2013-04-28 DIAGNOSIS — Y9389 Activity, other specified: Secondary | ICD-10-CM | POA: Insufficient documentation

## 2013-04-28 DIAGNOSIS — S139XXA Sprain of joints and ligaments of unspecified parts of neck, initial encounter: Secondary | ICD-10-CM | POA: Insufficient documentation

## 2013-04-28 DIAGNOSIS — Z872 Personal history of diseases of the skin and subcutaneous tissue: Secondary | ICD-10-CM | POA: Insufficient documentation

## 2013-04-28 DIAGNOSIS — IMO0002 Reserved for concepts with insufficient information to code with codable children: Secondary | ICD-10-CM | POA: Insufficient documentation

## 2013-04-28 DIAGNOSIS — S161XXA Strain of muscle, fascia and tendon at neck level, initial encounter: Secondary | ICD-10-CM

## 2013-04-28 DIAGNOSIS — F17200 Nicotine dependence, unspecified, uncomplicated: Secondary | ICD-10-CM | POA: Insufficient documentation

## 2013-04-28 DIAGNOSIS — S0990XA Unspecified injury of head, initial encounter: Secondary | ICD-10-CM | POA: Insufficient documentation
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
## 2013-04-30 NOTE — ED Provider Notes (Signed)
Medical screening examination/treatment/procedure(s) were performed by non-physician practitioner and as supervising physician I was immediately available for consultation/collaboration.  EKG Interpretation   None         Quamel Fitzmaurice H Adalis Gatti, MD 04/30/13 1436 

## 2013-05-02 ENCOUNTER — Encounter (HOSPITAL_BASED_OUTPATIENT_CLINIC_OR_DEPARTMENT_OTHER): Payer: Self-pay | Admitting: *Deleted

## 2013-05-02 NOTE — Progress Notes (Signed)
NPO AFTER MN.  ARRIVE AT 1015.  NEEDS HG. 

## 2013-05-03 ENCOUNTER — Other Ambulatory Visit: Payer: Self-pay | Admitting: Plastic Surgery

## 2013-05-03 DIAGNOSIS — S02609D Fracture of mandible, unspecified, subsequent encounter for fracture with routine healing: Secondary | ICD-10-CM
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
## 2013-05-06 ENCOUNTER — Encounter (HOSPITAL_BASED_OUTPATIENT_CLINIC_OR_DEPARTMENT_OTHER)
Admission: RE | Disposition: A | Discharge status: Home / Self Care | Payer: Self-pay | Source: Ambulatory Visit | Attending: Plastic Surgery

## 2013-05-06 ENCOUNTER — Ambulatory Visit (HOSPITAL_BASED_OUTPATIENT_CLINIC_OR_DEPARTMENT_OTHER): Payer: Self-pay | Admitting: Anesthesiology

## 2013-05-06 ENCOUNTER — Ambulatory Visit (HOSPITAL_BASED_OUTPATIENT_CLINIC_OR_DEPARTMENT_OTHER)
Admission: RE | Admit: 2013-05-06 | Discharge: 2013-05-06 | Disposition: A | Discharge status: Home / Self Care | Payer: Self-pay | Source: Ambulatory Visit | Attending: Plastic Surgery | Admitting: Plastic Surgery

## 2013-05-06 ENCOUNTER — Encounter (HOSPITAL_BASED_OUTPATIENT_CLINIC_OR_DEPARTMENT_OTHER): Payer: Self-pay | Admitting: Plastic Surgery

## 2013-05-06 ENCOUNTER — Encounter (HOSPITAL_BASED_OUTPATIENT_CLINIC_OR_DEPARTMENT_OTHER): Payer: Self-pay | Admitting: Anesthesiology

## 2013-05-06 DIAGNOSIS — IMO0001 Reserved for inherently not codable concepts without codable children: Secondary | ICD-10-CM | POA: Insufficient documentation

## 2013-05-06 DIAGNOSIS — F17200 Nicotine dependence, unspecified, uncomplicated: Secondary | ICD-10-CM | POA: Insufficient documentation

## 2013-05-06 DIAGNOSIS — S02609D Fracture of mandible, unspecified, subsequent encounter for fracture with routine healing: Secondary | ICD-10-CM

## 2013-05-06 HISTORY — DX: Fracture of mandible, unspecified, initial encounter for closed fracture: S02.609A

## 2013-05-06 HISTORY — PX: MANDIBULAR HARDWARE REMOVAL: SHX5205

## 2013-05-06 SURGERY — REMOVAL, HARDWARE, MANDIBLE
Anesthesia: Monitor Anesthesia Care | Site: Mouth

## 2013-05-06 MED ORDER — HYDROMORPHONE HCL PF 1 MG/ML IJ SOLN
0.2500 mg | INTRAMUSCULAR | Status: DC | PRN
  Administered 2013-05-06: 0.25 mg via INTRAVENOUS
  Filled 2013-05-06: qty 1

## 2013-05-06 MED ORDER — HYDROMORPHONE HCL PF 1 MG/ML IJ SOLN
INTRAMUSCULAR | Status: AC
  Filled 2013-05-06: qty 1

## 2013-05-06 MED ORDER — LACTATED RINGERS IV SOLN
INTRAVENOUS | Status: DC
  Administered 2013-05-06: 11:00:00 via INTRAVENOUS
  Filled 2013-05-06: qty 1000

## 2013-05-06 MED ORDER — LIDOCAINE HCL (CARDIAC) 20 MG/ML IV SOLN: INTRAVENOUS | Status: DC | PRN

## 2013-05-06 MED ORDER — FENTANYL CITRATE 0.05 MG/ML IJ SOLN
INTRAMUSCULAR | Status: AC
  Filled 2013-05-06: qty 4

## 2013-05-06 MED ORDER — CEFAZOLIN SODIUM-DEXTROSE 2-3 GM-% IV SOLR
2.0000 g | INTRAVENOUS | Status: DC
  Filled 2013-05-06: qty 50

## 2013-05-06 MED ORDER — MIDAZOLAM HCL 2 MG/2ML IJ SOLN
INTRAMUSCULAR | Status: AC
  Filled 2013-05-06: qty 2

## 2013-05-06 MED ORDER — CEFAZOLIN SODIUM-DEXTROSE 2-3 GM-% IV SOLR
INTRAVENOUS | Status: DC | PRN
  Administered 2013-05-06: 2 g via INTRAVENOUS

## 2013-05-06 MED ORDER — LACTATED RINGERS IV SOLN
INTRAVENOUS | Status: DC | PRN
  Administered 2013-05-06 (×2): via INTRAVENOUS

## 2013-05-06 MED ORDER — KETOROLAC TROMETHAMINE 30 MG/ML IJ SOLN
INTRAMUSCULAR | Status: DC | PRN
  Administered 2013-05-06: 30 mg via INTRAVENOUS

## 2013-05-06 MED ORDER — PROPOFOL 10 MG/ML IV BOLUS
INTRAVENOUS | Status: DC | PRN
  Administered 2013-05-06: 200 mg via INTRAVENOUS
  Administered 2013-05-06 (×2): 20 mg via INTRAVENOUS

## 2013-05-06 MED ORDER — LIDOCAINE HCL (CARDIAC) 20 MG/ML IV SOLN
INTRAVENOUS | Status: DC | PRN
  Administered 2013-05-06: 80 mg via INTRAVENOUS

## 2013-05-06 MED ORDER — DEXAMETHASONE SODIUM PHOSPHATE 4 MG/ML IJ SOLN
INTRAMUSCULAR | Status: DC | PRN
  Administered 2013-05-06: 10 mg via INTRAVENOUS

## 2013-05-06 MED ORDER — MIDAZOLAM HCL 5 MG/5ML IJ SOLN
INTRAMUSCULAR | Status: DC | PRN
  Administered 2013-05-06: 2 mg via INTRAVENOUS

## 2013-05-06 MED ORDER — PROMETHAZINE HCL 25 MG/ML IJ SOLN
6.2500 mg | INTRAMUSCULAR | Status: DC | PRN
  Filled 2013-05-06: qty 1

## 2013-05-06 MED ORDER — SODIUM CHLORIDE 0.9 % IR SOLN
Status: DC | PRN
  Administered 2013-05-06: 12:00:00

## 2013-05-06 MED ORDER — GLYCOPYRROLATE 0.2 MG/ML IJ SOLN
INTRAMUSCULAR | Status: DC | PRN
  Administered 2013-05-06: 0.2 mg via INTRAVENOUS

## 2013-05-06 MED ORDER — ONDANSETRON HCL 4 MG/2ML IJ SOLN
INTRAMUSCULAR | Status: DC | PRN
  Administered 2013-05-06: 4 mg via INTRAVENOUS

## 2013-05-06 MED ORDER — FENTANYL CITRATE 0.05 MG/ML IJ SOLN
INTRAMUSCULAR | Status: DC | PRN
  Administered 2013-05-06 (×3): 12.5 ug via INTRAVENOUS
  Administered 2013-05-06: 25 ug via INTRAVENOUS
  Administered 2013-05-06: 12.5 ug via INTRAVENOUS
  Administered 2013-05-06: 25 ug via INTRAVENOUS
  Administered 2013-05-06 (×5): 12.5 ug via INTRAVENOUS
  Administered 2013-05-06: 25 ug via INTRAVENOUS
  Administered 2013-05-06: 12.5 ug via INTRAVENOUS

## 2013-05-06 SURGICAL SUPPLY — 53 items
ADH SKN CLS APL DERMABOND .7 (GAUZE/BANDAGES/DRESSINGS)
APL SKNCLS STERI-STRIP NONHPOA (GAUZE/BANDAGES/DRESSINGS)
BAND RUBBER #16 2-1/2X1/16 STR (MISCELLANEOUS) ×1 IMPLANT
BENZOIN TINCTURE PRP APPL 2/3 (GAUZE/BANDAGES/DRESSINGS) IMPLANT
BLADE SURG 15 STRL LF DISP TIS (BLADE) IMPLANT
BLADE SURG 15 STRL SS (BLADE)
BLADE SURG ROTATE 9660 (MISCELLANEOUS) IMPLANT
BNDG CMPR MD 5X2 ELC HKLP STRL (GAUZE/BANDAGES/DRESSINGS)
BNDG CONFORM 2 STRL LF (GAUZE/BANDAGES/DRESSINGS) IMPLANT
BNDG ELASTIC 2 VLCR STRL LF (GAUZE/BANDAGES/DRESSINGS) IMPLANT
CANISTER SUCTION 1200CC (MISCELLANEOUS) IMPLANT
CLEANER CAUTERY TIP 5X5 PAD (MISCELLANEOUS) IMPLANT
CLOTH BEACON ORANGE TIMEOUT ST (SAFETY) ×2 IMPLANT
CORDS BIPOLAR (ELECTRODE) IMPLANT
DERMABOND ADVANCED (GAUZE/BANDAGES/DRESSINGS)
DERMABOND ADVANCED .7 DNX12 (GAUZE/BANDAGES/DRESSINGS) IMPLANT
ELECT NDL BLADE 2-5/6 (NEEDLE) ×1 IMPLANT
ELECT NEEDLE BLADE 2-5/6 (NEEDLE) ×2 IMPLANT
ELECT REM PT RETURN 9FT ADLT (ELECTROSURGICAL) ×2
ELECT REM PT RETURN 9FT PED (ELECTROSURGICAL)
ELECTRODE REM PT RETRN 9FT PED (ELECTROSURGICAL) IMPLANT
ELECTRODE REM PT RTRN 9FT ADLT (ELECTROSURGICAL) ×1 IMPLANT
GAUZE SPONGE 4X4 12PLY STRL LF (GAUZE/BANDAGES/DRESSINGS) IMPLANT
GAUZE XEROFORM 1X8 LF (GAUZE/BANDAGES/DRESSINGS) IMPLANT
GLOVE BIO SURGEON STRL SZ 6.5 (GLOVE) ×4 IMPLANT
GOWN PREVENTION PLUS LG XLONG (DISPOSABLE) ×2 IMPLANT
NDL HYPO 30GX1 BEV (NEEDLE) IMPLANT
NEEDLE 27GAX1X1/2 (NEEDLE) ×3 IMPLANT
NEEDLE HYPO 30GX1 BEV (NEEDLE) IMPLANT
NS IRRIG 500ML POUR BTL (IV SOLUTION) ×2 IMPLANT
PACK BASIN DAY SURGERY FS (CUSTOM PROCEDURE TRAY) ×2 IMPLANT
PACK ENT DAY SURGERY (CUSTOM PROCEDURE TRAY) ×2 IMPLANT
PAD CLEANER CAUTERY TIP 5X5 (MISCELLANEOUS)
PENCIL BUTTON HOLSTER BLD 10FT (ELECTRODE) ×2 IMPLANT
SHEET MEDIUM DRAPE 40X70 STRL (DRAPES) IMPLANT
SPONGE GAUZE 2X2 8PLY STRL LF (GAUZE/BANDAGES/DRESSINGS) IMPLANT
STRIP CLOSURE SKIN 1/2X4 (GAUZE/BANDAGES/DRESSINGS) IMPLANT
SUCTION FRAZIER TIP 10 FR DISP (SUCTIONS) IMPLANT
SUT CHROMIC 4 0 P 3 18 (SUTURE) IMPLANT
SUT ETHILON 4 0 PS 2 18 (SUTURE) IMPLANT
SUT ETHILON 5 0 P 3 18 (SUTURE)
SUT MNCRL 6-0 UNDY P1 1X18 (SUTURE) IMPLANT
SUT MNCRL AB 4-0 PS2 18 (SUTURE) IMPLANT
SUT MON AB 5-0 P3 18 (SUTURE) IMPLANT
SUT MONOCRYL 6-0 P1 1X18 (SUTURE)
SUT NYLON ETHILON 5-0 P-3 1X18 (SUTURE) IMPLANT
SUT PLAIN 5 0 P 3 18 (SUTURE) IMPLANT
SUT VIC AB 5-0 P-3 18X BRD (SUTURE) IMPLANT
SUT VIC AB 5-0 P3 18 (SUTURE) ×2
SUT VICRYL 4-0 PS2 18IN ABS (SUTURE) ×1 IMPLANT
SYR BULB 3OZ (MISCELLANEOUS) ×2 IMPLANT
TRAY DSU PREP LF (CUSTOM PROCEDURE TRAY) ×1 IMPLANT
WATER STERILE IRR 500ML POUR (IV SOLUTION) ×1 IMPLANT

## 2013-05-06 NOTE — Anesthesia Procedure Notes (Signed)
Procedure Name: LMA Insertion Date/Time: 05/06/2013 12:19 PM Performed by: Jessica Priest Pre-anesthesia Checklist: Patient identified, Emergency Drugs available, Suction available and Patient being monitored Patient Re-evaluated:Patient Re-evaluated prior to inductionOxygen Delivery Method: Circle System Utilized Preoxygenation: Pre-oxygenation with 100% oxygen Intubation Type: IV induction Ventilation: Mask ventilation without difficulty LMA: LMA inserted LMA Size: 4.0 Number of attempts: 1 Airway Equipment and Method: bite block Placement Confirmation: positive ETCO2 Tube secured with: Tape Dental Injury: Teeth and Oropharynx as per pre-operative assessment

## 2013-05-06 NOTE — Brief Op Note (Signed)
05/06/2013  12:37 PM  PATIENT:  Micheal Schmidt  27 y.o. male  PRE-OPERATIVE DIAGNOSIS:  mandible fracture  POST-OPERATIVE DIAGNOSIS:  mandible fracture  PROCEDURE:  Procedure(s): REMOVAL OF IM SCREWS AND WIRES (N/A)  SURGEON:  Surgeon(s) and Role:    * Claire Sanger, DO - Primary  PHYSICIAN ASSISTANT: none  ASSISTANTS: none   ANESTHESIA:   general  EBL:  Total I/O In: 1000 [I.V.:1000] Out: -   BLOOD ADMINISTERED:none  DRAINS: none   LOCAL MEDICATIONS USED:  NONE  SPECIMEN:  No Specimen  DISPOSITION OF SPECIMEN:  N/A  COUNTS:  YES  TOURNIQUET:  * No tourniquets in log *  DICTATION: .Dragon Dictation  PLAN OF CARE: Discharge to home after PACU  PATIENT DISPOSITION:  PACU - hemodynamically stable.   Delay start of Pharmacological VTE agent (>24hrs) due to surgical blood loss or risk of bleeding: no

## 2013-05-06 NOTE — Op Note (Signed)
Operative Note   DATE OF OPERATION: 05/06/2013  LOCATION: [facility]  SURGICAL DIVISION: Plastic Surgery  PREOPERATIVE DIAGNOSES:  Mandible fracture  POSTOPERATIVE DIAGNOSES:  same  PROCEDURE:  Removal of Maxillomandibular hardware  SURGEON: [name], DO  ASSISTANT: Shawn Rayburn, PA  ANESTHESIA:  General.   COMPLICATIONS: None.   INDICATIONS FOR PROCEDURE:  The patient, Micheal Schmidt, is a 27 y.o. male born on [birth year], is here for treatment of a mandible fracture.   CONSENT:  Informed consent was obtained directly from the patient. Risks, benefits and alternatives were fully discussed. Specific risks including but not limited to bleeding, infection, hematoma, seroma, scarring, pain, implant infection, implant extrusion, capsular contracture, asymmetry, wound healing problems, and need for further surgery were all discussed. The patient did have an ample opportunity to have questions answered to satisfaction.   DESCRIPTION OF PROCEDURE:  The patient was taken to the operating room. SCDs were placed. The patient's operative site was prepped and draped in a sterile fashion. A time out was performed and all information was confirmed to be correct.  General anesthesia was administered.  The wires were cut and the LMA was placed.  The bovie was used to expose the MMF screws.  The screws were then removed and the incision closed with 4-0 and 5-0 vicryl sutures.  The patient tolerated the procedure well.  He had good occlusion. The patient was allowed to wake from anesthesia, extubated and taken to the recovery room in satisfactory condition.

## 2013-05-06 NOTE — Anesthesia Preprocedure Evaluation (Addendum)
Anesthesia Evaluation  Patient identified by MRN, date of birth, ID band Patient awake    Reviewed: Allergy & Precautions, H&P , NPO status , Patient's Chart, lab work & pertinent test results  Airway Mallampati: II TM Distance: >3 FB Neck ROM: Full  Mouth opening: Limited Mouth Opening  Dental no notable dental hx.    Pulmonary Current Smoker,  breath sounds clear to auscultation  Pulmonary exam normal       Cardiovascular negative cardio ROS  Rhythm:Regular Rate:Normal     Neuro/Psych negative neurological ROS  negative psych ROS   GI/Hepatic negative GI ROS, Neg liver ROS,   Endo/Other  negative endocrine ROS  Renal/GU negative Renal ROS  negative genitourinary   Musculoskeletal negative musculoskeletal ROS (+)   Abdominal   Peds negative pediatric ROS (+)  Hematology negative hematology ROS (+)   Anesthesia Other Findings   Reproductive/Obstetrics negative OB ROS                          Anesthesia Physical Anesthesia Plan  ASA: II  Anesthesia Plan: General and MAC   Post-op Pain Management:    Induction: Intravenous  Airway Management Planned: Nasal ETT  Additional Equipment:   Intra-op Plan:   Post-operative Plan: Extubation in OR  Informed Consent: I have reviewed the patients History and Physical, chart, labs and discussed the procedure including the risks, benefits and alternatives for the proposed anesthesia with the patient or authorized representative who has indicated his/her understanding and acceptance.   Dental advisory given  Plan Discussed with: CRNA and Surgeon  Anesthesia Plan Comments:       Anesthesia Quick Evaluation

## 2013-05-06 NOTE — Interval H&P Note (Signed)
History and Physical Interval Note:  05/06/2013 8:26 AM  Micheal Schmidt  has presented today for surgery, with the diagnosis of mandible fracture  The various methods of treatment have been discussed with the patient and family. After consideration of risks, benefits and other options for treatment, the patient has consented to  Procedure(s): REMOVAL OF IM SCREWS AND WIRES (N/A) as a surgical intervention .  The patient's history has been reviewed, patient examined, no change in status, stable for surgery.  I have reviewed the patient's chart and labs.  Questions were answered to the patient's satisfaction.     SANGER,Bernisha Verma
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
## 2013-05-06 NOTE — Transfer of Care (Signed)
Immediate Anesthesia Transfer of Care Note  Patient: Micheal Schmidt  Procedure(s) Performed: Procedure(s) (LRB): REMOVAL OF IM SCREWS AND WIRES (N/A)  Patient Location: PACU  Anesthesia Type: General  Level of Consciousness: awake, sedated, patient cooperative and responds to stimulation  Airway & Oxygen Therapy: Patient Spontanous Breathing and Patient connected to face mask oxygen  Post-op Assessment: Report given to PACU RN, Post -op Vital signs reviewed and stable and Patient moving all extremities  Post vital signs: Reviewed and stable  Complications: No apparent anesthesia complications

## 2013-05-07 ENCOUNTER — Encounter (HOSPITAL_BASED_OUTPATIENT_CLINIC_OR_DEPARTMENT_OTHER): Payer: Self-pay | Admitting: Plastic Surgery

## 2013-05-12 NOTE — Anesthesia Postprocedure Evaluation (Signed)
  Anesthesia Post-op Note  Patient: Quinlin Conant  Procedure(s) Performed: Procedure(s) (LRB): REMOVAL OF IM SCREWS AND WIRES (N/A)  Patient Location: PACU  Anesthesia Type: General  Level of Consciousness: awake and alert   Airway and Oxygen Therapy: Patient Spontanous Breathing  Post-op Pain: mild  Post-op Assessment: Post-op Vital signs reviewed, Patient's Cardiovascular Status Stable, Respiratory Function Stable, Patent Airway and No signs of Nausea or vomiting  Last Vitals:  Filed Vitals:   05/06/13 1423  BP: 94/63  Pulse: 100  Temp: 36.5 C  Resp: 18    Post-op Vital Signs: stable   Complications: No apparent anesthesia complications

## 2013-06-18 ENCOUNTER — Emergency Department (HOSPITAL_COMMUNITY): Payer: Self-pay

## 2013-06-18 ENCOUNTER — Emergency Department (HOSPITAL_COMMUNITY)
Admission: EM | Admit: 2013-06-18 | Discharge: 2013-06-18 | Disposition: A | Discharge status: Home / Self Care | Payer: Self-pay | Attending: Emergency Medicine | Admitting: Emergency Medicine

## 2013-06-18 ENCOUNTER — Encounter (HOSPITAL_COMMUNITY): Payer: Self-pay | Admitting: Emergency Medicine

## 2013-06-18 DIAGNOSIS — Z87828 Personal history of other (healed) physical injury and trauma: Secondary | ICD-10-CM | POA: Insufficient documentation

## 2013-06-18 DIAGNOSIS — F17200 Nicotine dependence, unspecified, uncomplicated: Secondary | ICD-10-CM | POA: Insufficient documentation

## 2013-06-18 DIAGNOSIS — N509 Disorder of male genital organs, unspecified: Secondary | ICD-10-CM | POA: Insufficient documentation

## 2013-06-18 DIAGNOSIS — N498 Inflammatory disorders of other specified male genital organs: Secondary | ICD-10-CM | POA: Insufficient documentation

## 2013-06-18 DIAGNOSIS — N492 Inflammatory disorders of scrotum: Secondary | ICD-10-CM

## 2013-06-18 DIAGNOSIS — N50811 Right testicular pain: Secondary | ICD-10-CM

## 2013-06-18 LAB — CBC
HCT: 44.4 % (ref 39.0–52.0)
Hemoglobin: 15.9 g/dL (ref 13.0–17.0)
MCH: 31.7 pg (ref 26.0–34.0)
MCHC: 35.8 g/dL (ref 30.0–36.0)
MCV: 88.6 fL (ref 78.0–100.0)
Platelets: 212 10*3/uL (ref 150–400)
RBC: 5.01 MIL/uL (ref 4.22–5.81)
RDW: 14.7 % (ref 11.5–15.5)
WBC: 15.8 10*3/uL — ABNORMAL HIGH (ref 4.0–10.5)

## 2013-06-18 LAB — BASIC METABOLIC PANEL
BUN: 10 mg/dL (ref 6–23)
CO2: 26 mEq/L (ref 19–32)
Calcium: 9.9 mg/dL (ref 8.4–10.5)
Chloride: 95 mEq/L — ABNORMAL LOW (ref 96–112)
Creatinine, Ser: 0.79 mg/dL (ref 0.50–1.35)
GFR calc Af Amer: >90 mL/min (ref >90)
GFR calc non Af Amer: >90 mL/min (ref >90)
Glucose, Bld: 88 mg/dL (ref 70–99)
Potassium: 3.7 mEq/L (ref 3.7–5.3)
Sodium: 137 mEq/L (ref 137–147)

## 2013-06-18 MED ORDER — TRAMADOL HCL 50 MG PO TABS
50.0000 mg | ORAL_TABLET | Freq: Once | ORAL | Status: AC
  Administered 2013-06-18: 50 mg via ORAL
  Filled 2013-06-18: qty 1

## 2013-06-18 MED ORDER — OXYCODONE-ACETAMINOPHEN 5-325 MG PO TABS: ORAL_TABLET | ORAL | Status: DC

## 2013-06-18 MED ORDER — DOXYCYCLINE HYCLATE 100 MG PO CAPS: 100.0000 mg | ORAL_CAPSULE | Freq: Two times a day (BID) | ORAL | Status: DC

## 2013-06-18 MED ORDER — OXYCODONE-ACETAMINOPHEN 5-325 MG PO TABS
2.0000 | ORAL_TABLET | Freq: Once | ORAL | Status: AC
  Administered 2013-06-18: 2 via ORAL
  Filled 2013-06-18: qty 2

## 2013-06-18 NOTE — ED Notes (Signed)
Called main lab, spoke with Roseanne KaufmanValiera, no BMP tube there need to collect.

## 2013-06-18 NOTE — ED Provider Notes (Signed)
Medical screening examination/treatment/procedure(s) were performed by non-physician practitioner and as supervising physician I was immediately available for consultation/collaboration.    Nelia Shiobert L Meagan Spease, MD 06/18/13 [phone]

## 2013-06-18 NOTE — ED Notes (Signed)
PT comfortable with d/c and f/u instructions. Prescriptions x2 

## 2013-06-18 NOTE — ED Notes (Signed)
Pt reports bilateral swelling to testicles since this am. Painful to touch/walk. Hx of boil to same area. States he was supposed to have surgery, but never heard anything.

## 2013-06-18 NOTE — Discharge Instructions (Signed)
Cellulitis Cellulitis is an infection of the skin and the tissue under the skin. The infected area is usually red and tender. This happens most often in the arms and lower legs. HOME CARE   Take your antibiotic medicine as told. Finish the medicine even if you start to feel better.  Keep the infected arm or leg raised (elevated).  Put a warm cloth on the area up to 4 times per day.  Only take medicines as told by your doctor.  Keep all doctor visits as told. GET HELP RIGHT AWAY IF:   You have a fever.  You feel very sleepy.  You throw up (vomit) or have watery poop (diarrhea).  You feel sick and have muscle aches and pains.  You see red streaks on the skin coming from the infected area.  Your red area gets bigger or turns a dark color.  Your bone or joint under the infected area is painful after the skin heals.  Your infection comes back in the same area or different area.  You have a puffy (swollen) bump in the infected area.  You have new symptoms. MAKE SURE YOU:   Understand these instructions.  Will watch your condition.  Will get help right away if you are not doing well or get worse. Document Released: 10/19/2007 Document Revised: 11/01/2011 Document Reviewed: 07/18/2011 [facility] Patient Information 2014 [place], [place].  Epididymitis Epididymitis is a swelling (inflammation) of the epididymis. The epididymis is a cord-like structure along the back part of the testicle. Epididymitis is usually, but not always, caused by infection. This is usually a sudden problem beginning with chills, fever and pain behind the scrotum and in the testicle. There may be swelling and redness of the testicle. DIAGNOSIS  Physical examination will reveal a tender, swollen epididymis. Sometimes, cultures are obtained from the urine or from prostate secretions to help find out if there is an infection or if the cause is a different problem. Sometimes, blood work is performed to see if  your white blood cell count is elevated and if a germ (bacterial) or viral infection is present. Using this knowledge, an appropriate medicine which kills germs (antibiotic) can be chosen by your caregiver. A viral infection causing epididymitis will most often go away (resolve) without treatment. HOME CARE INSTRUCTIONS   Hot sitz baths for 20 minutes, 4 times per day, may help relieve pain.  Only take over-the-counter or prescription medicines for pain, discomfort or fever as directed by your caregiver.  Take all medicines, including antibiotics, as directed. Take the antibiotics for the full prescribed length of time even if you are feeling better.  It is very important to keep all follow-up appointments. SEEK IMMEDIATE MEDICAL CARE IF:   You have a fever.  You have pain not relieved with medicines.  You have any worsening of your problems.  Your pain seems to come and go.  You develop pain, redness, and swelling in the scrotum and surrounding areas. MAKE SURE YOU:   Understand these instructions.  Will watch your condition.  Will get help right away if you are not doing well or get worse. Document Released: 04/29/2000 Document Revised: 07/25/2011 Document Reviewed: 03/19/2009 [facility] Patient Information 2014 [place], [place].
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
## 2013-12-07 ENCOUNTER — Emergency Department (HOSPITAL_COMMUNITY): Payer: No Typology Code available for payment source

## 2013-12-07 ENCOUNTER — Emergency Department (HOSPITAL_COMMUNITY)
Admission: EM | Admit: 2013-12-07 | Discharge: 2013-12-07 | Disposition: A | Discharge status: Home / Self Care | Payer: No Typology Code available for payment source | Attending: Emergency Medicine | Admitting: Emergency Medicine

## 2013-12-07 ENCOUNTER — Emergency Department (HOSPITAL_COMMUNITY): Payer: Self-pay

## 2013-12-07 ENCOUNTER — Encounter (HOSPITAL_COMMUNITY): Payer: Self-pay | Admitting: Emergency Medicine

## 2013-12-07 DIAGNOSIS — S060X9A Concussion with loss of consciousness of unspecified duration, initial encounter: Secondary | ICD-10-CM

## 2013-12-07 DIAGNOSIS — S53401A Unspecified sprain of right elbow, initial encounter: Secondary | ICD-10-CM

## 2013-12-07 DIAGNOSIS — Y92410 Unspecified street and highway as the place of occurrence of the external cause: Secondary | ICD-10-CM | POA: Insufficient documentation

## 2013-12-07 DIAGNOSIS — T07XXXA Unspecified multiple injuries, initial encounter: Secondary | ICD-10-CM

## 2013-12-07 DIAGNOSIS — Z87828 Personal history of other (healed) physical injury and trauma: Secondary | ICD-10-CM | POA: Insufficient documentation

## 2013-12-07 DIAGNOSIS — Y9389 Activity, other specified: Secondary | ICD-10-CM | POA: Insufficient documentation

## 2013-12-07 DIAGNOSIS — S060X0A Concussion without loss of consciousness, initial encounter: Secondary | ICD-10-CM | POA: Insufficient documentation

## 2013-12-07 DIAGNOSIS — IMO0002 Reserved for concepts with insufficient information to code with codable children: Secondary | ICD-10-CM | POA: Insufficient documentation

## 2013-12-07 DIAGNOSIS — S298XXA Other specified injuries of thorax, initial encounter: Secondary | ICD-10-CM

## 2013-12-07 DIAGNOSIS — S53402A Unspecified sprain of left elbow, initial encounter: Secondary | ICD-10-CM

## 2013-12-07 DIAGNOSIS — F17200 Nicotine dependence, unspecified, uncomplicated: Secondary | ICD-10-CM | POA: Insufficient documentation

## 2013-12-07 MED ORDER — OXYCODONE-ACETAMINOPHEN 5-325 MG PO TABS: 1.0000 | ORAL_TABLET | Freq: Three times a day (TID) | ORAL | Status: DC | PRN

## 2013-12-07 MED ORDER — OXYCODONE-ACETAMINOPHEN 5-325 MG PO TABS
1.0000 | ORAL_TABLET | Freq: Once | ORAL | Status: AC
  Administered 2013-12-07: 1 via ORAL
  Filled 2013-12-07: qty 1
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
## 2014-06-10 ENCOUNTER — Encounter (HOSPITAL_COMMUNITY): Payer: Self-pay | Admitting: *Deleted

## 2014-06-10 ENCOUNTER — Emergency Department (HOSPITAL_COMMUNITY)
Admission: EM | Admit: 2014-06-10 | Discharge: 2014-06-10 | Disposition: A | Discharge status: Home / Self Care | Payer: No Typology Code available for payment source | Attending: Emergency Medicine | Admitting: Emergency Medicine

## 2014-06-10 DIAGNOSIS — K0889 Other specified disorders of teeth and supporting structures: Secondary | ICD-10-CM

## 2014-06-10 DIAGNOSIS — Z720 Tobacco use: Secondary | ICD-10-CM | POA: Insufficient documentation

## 2014-06-10 DIAGNOSIS — Z8781 Personal history of (healed) traumatic fracture: Secondary | ICD-10-CM | POA: Insufficient documentation

## 2014-06-10 DIAGNOSIS — G479 Sleep disorder, unspecified: Secondary | ICD-10-CM | POA: Insufficient documentation

## 2014-06-10 DIAGNOSIS — K088 Other specified disorders of teeth and supporting structures: Secondary | ICD-10-CM | POA: Insufficient documentation

## 2014-06-10 MED ORDER — OXYCODONE-ACETAMINOPHEN 5-325 MG PO TABS
1.0000 | ORAL_TABLET | Freq: Once | ORAL | Status: AC
  Administered 2014-06-10: 1 via ORAL
  Filled 2014-06-10: qty 1

## 2014-06-10 MED ORDER — PENICILLIN V POTASSIUM 250 MG PO TABS: 250.0000 mg | ORAL_TABLET | Freq: Four times a day (QID) | ORAL | Status: AC

## 2014-06-10 MED ORDER — HYDROCODONE-ACETAMINOPHEN 5-325 MG PO TABS: 1.0000 | ORAL_TABLET | Freq: Four times a day (QID) | ORAL | Status: DC | PRN

## 2014-06-10 NOTE — Discharge Instructions (Signed)
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
## 2014-06-10 NOTE — ED Notes (Signed)
Pt reports left side dental pain since Friday. Airway intact.

## 2014-06-22 ENCOUNTER — Emergency Department (HOSPITAL_COMMUNITY)
Admission: EM | Admit: 2014-06-22 | Discharge: 2014-06-22 | Disposition: A | Discharge status: Home / Self Care | Payer: No Typology Code available for payment source | Attending: Emergency Medicine | Admitting: Emergency Medicine

## 2014-06-22 ENCOUNTER — Encounter (HOSPITAL_COMMUNITY): Payer: Self-pay | Admitting: *Deleted

## 2014-06-22 DIAGNOSIS — Z87828 Personal history of other (healed) physical injury and trauma: Secondary | ICD-10-CM | POA: Insufficient documentation

## 2014-06-22 DIAGNOSIS — Z720 Tobacco use: Secondary | ICD-10-CM | POA: Insufficient documentation

## 2014-06-22 DIAGNOSIS — M273 Alveolitis of jaws: Secondary | ICD-10-CM

## 2014-06-22 DIAGNOSIS — Z8781 Personal history of (healed) traumatic fracture: Secondary | ICD-10-CM | POA: Insufficient documentation

## 2014-06-22 MED ORDER — OXYCODONE-ACETAMINOPHEN 5-325 MG PO TABS
2.0000 | ORAL_TABLET | Freq: Once | ORAL | Status: AC
  Administered 2014-06-22: 2 via ORAL
  Filled 2014-06-22: qty 2

## 2014-06-22 MED ORDER — OXYCODONE-ACETAMINOPHEN 5-325 MG PO TABS: 1.0000 | ORAL_TABLET | Freq: Four times a day (QID) | ORAL | Status: DC | PRN

## 2014-06-22 NOTE — Discharge Instructions (Signed)
Take pain medication as prescribed for pain.  Do not drive or operate heavy machinery for 4-6 hours after taking pain medication.  Do not take additional Tylenol along with the pain medication.
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
## 2014-06-22 NOTE — ED Notes (Signed)
Pt reports mouth continues to hurt after tooth extraction on Thursday.

## 2015-05-09 ENCOUNTER — Emergency Department (HOSPITAL_COMMUNITY)
Admission: EM | Admit: 2015-05-09 | Discharge: 2015-05-09 | Disposition: A | Discharge status: Home / Self Care | Payer: Self-pay | Attending: Emergency Medicine | Admitting: Emergency Medicine

## 2015-05-09 ENCOUNTER — Encounter (HOSPITAL_COMMUNITY): Payer: Self-pay

## 2015-05-09 DIAGNOSIS — Z87828 Personal history of other (healed) physical injury and trauma: Secondary | ICD-10-CM | POA: Insufficient documentation

## 2015-05-09 DIAGNOSIS — Z8781 Personal history of (healed) traumatic fracture: Secondary | ICD-10-CM | POA: Insufficient documentation

## 2015-05-09 DIAGNOSIS — Z113 Encounter for screening for infections with a predominantly sexual mode of transmission: Secondary | ICD-10-CM | POA: Insufficient documentation

## 2015-05-09 DIAGNOSIS — F17210 Nicotine dependence, cigarettes, uncomplicated: Secondary | ICD-10-CM | POA: Insufficient documentation

## 2015-05-09 NOTE — ED Notes (Signed)
Declined W/C at D/C and was escorted to lobby by RN. 

## 2015-05-09 NOTE — Discharge Instructions (Signed)
Follow up with health dept. If cultures come back positive we will call you  Safe Sex Safe sex is about reducing the risk of giving or getting a sexually transmitted disease (STD). STDs are spread through sexual contact involving the genitals, mouth, or rectum. Some STDs can be cured and others cannot. Safe sex can also prevent unintended pregnancies.  WHAT ARE SOME SAFE SEX PRACTICES?  Limit your sexual activity to only one partner who is having sex with only you.  Talk to your partner about his or her past partners, past STDs, and drug use.  Use a condom every time you have sexual intercourse. This includes vaginal, oral, and anal sexual activity. Both females and males should wear condoms during oral sex. Only use latex or polyurethane condoms and water-based lubricants. Using petroleum-based lubricants or oils to lubricate a condom will weaken the condom and increase the chance that it will break. The condom should be in place from the beginning to the end of sexual activity. Wearing a condom reduces, but does not completely eliminate, your risk of getting or giving an STD. STDs can be spread by contact with infected body fluids and skin.  Get vaccinated for hepatitis B and HPV.  Avoid alcohol and recreational drugs, which can affect your judgment. You may forget to use a condom or participate in high-risk sex.  For females, avoid douching after sexual intercourse. Douching can spread an infection farther into the reproductive tract.  Check your body for signs of sores, blisters, rashes, or unusual discharge. See your health care provider if you notice any of these signs.  Avoid sexual contact if you have symptoms of an infection or are being treated for an STD. If you or your partner has herpes, avoid sexual contact when blisters are present. Use condoms at all other times.  If you are at risk of being infected with HIV, it is recommended that you take a prescription medicine daily to  prevent HIV infection. This is called pre-exposure prophylaxis (PrEP). You are considered at risk if:  You are a man who has sex with other men (MSM).  You are a heterosexual man or woman who is sexually active with more than one partner.  You take drugs by injection.  You are sexually active with a partner who has HIV.  Talk with your health care provider about whether you are at high risk of being infected with HIV. If you choose to begin PrEP, you should first be tested for HIV. You should then be tested every 3 months for as long as you are taking PrEP.  See your health care provider for regular screenings, exams, and tests for other STDs. Before having sex with a new partner, each of you should be screened for STDs and should talk about the results with each other. WHAT ARE THE BENEFITS OF SAFE SEX?   There is less chance of getting or giving an STD.  You can prevent unwanted or unintended pregnancies.  By discussing safe sex concerns with your partner, you may increase feelings of intimacy, comfort, trust, and honesty between the two of you.   This information is not intended to replace advice given to you by your health care provider. Make sure you discuss any questions you have with your health care provider.   Document Released: 06/09/2004 Document Revised: 05/23/2014 Document Reviewed: 10/24/2011 Elsevier Interactive Patient Education [employer].
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
## 2015-05-09 NOTE — ED Notes (Signed)
Pt is here for STD check. He denies having any symptoms of any STD, just reports he wants to be checked.

## 2015-05-13 ENCOUNTER — Telehealth (HOSPITAL_COMMUNITY): Payer: Self-pay

## 2015-05-15 ENCOUNTER — Telehealth (HOSPITAL_COMMUNITY): Payer: Self-pay

## 2015-05-15 NOTE — Telephone Encounter (Signed)
Pt calling for STD results. ID verified x 2.   Informed results not back.

## 2015-05-16 LAB — GC/CHLAMYDIA PROBE AMP (~~LOC~~) NOT AT ARMC
Chlamydia: NEGATIVE
Neisseria Gonorrhea: NEGATIVE

## 2015-06-05 ENCOUNTER — Telehealth (HOSPITAL_COMMUNITY): Payer: Self-pay

## 2015-07-28 IMAGING — US US SCROTUM
1 series · 13 of 25 positions shown · non-contrast
Comparison: None.

CLINICAL DATA: Scrotal abscess due to a sexually transmitted
disease.

EXAM:
SCROTAL ULTRASOUND
DOPPLER ULTRASOUND OF THE TESTICLES
TECHNIQUE: Complete ultrasound examination of the testicles, epididymis, and
other scrotal structures was performed. Color and spectral Doppler
ultrasound were also utilized to evaluate blood flow to the
testicles.

[Series 1: us scrotum · 0.06mm/px · 13 of 51 slices shown]
[im 1/51]
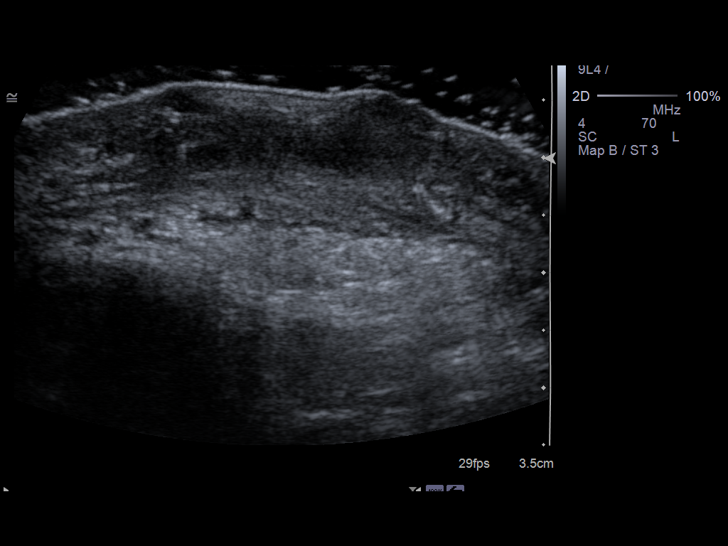
[im 5/51]
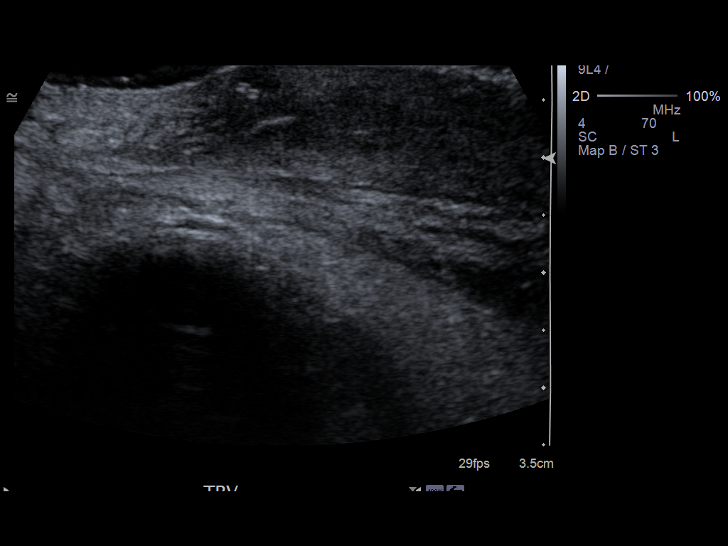
[im 9/51]
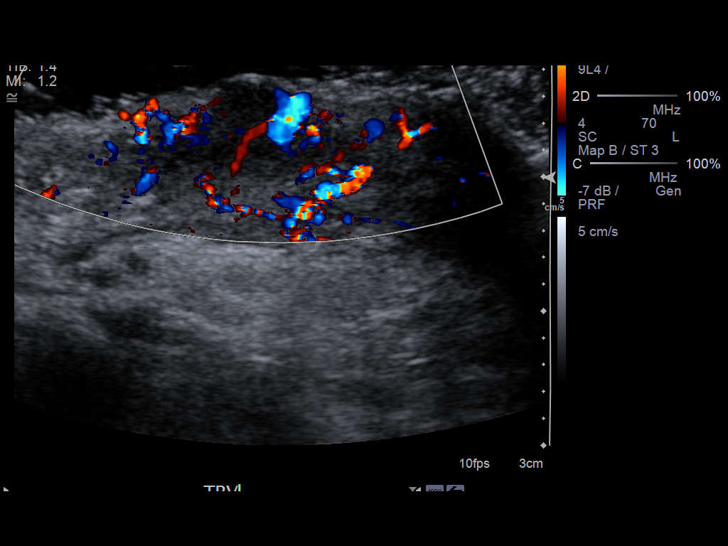
[im 13/51]
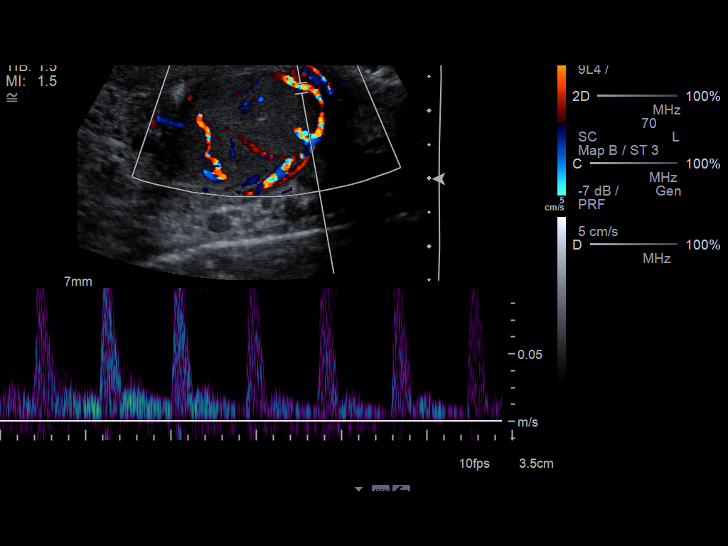
[im 17/51]
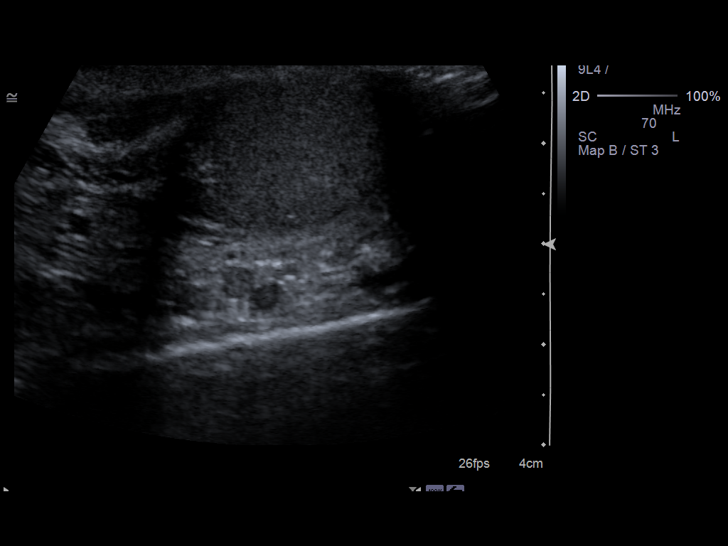
[im 21/51]
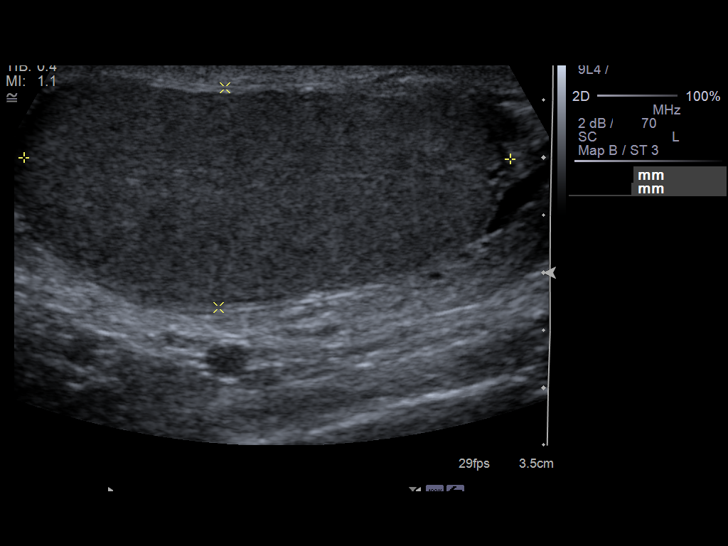
[im 26/51]
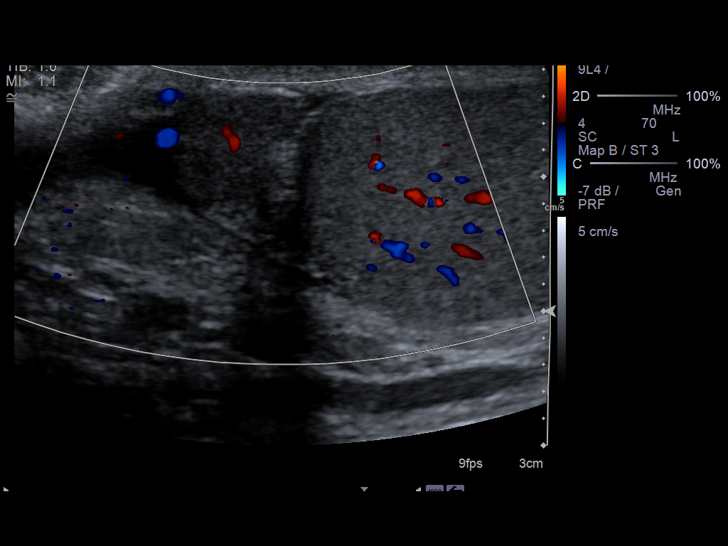
[im 30/51]
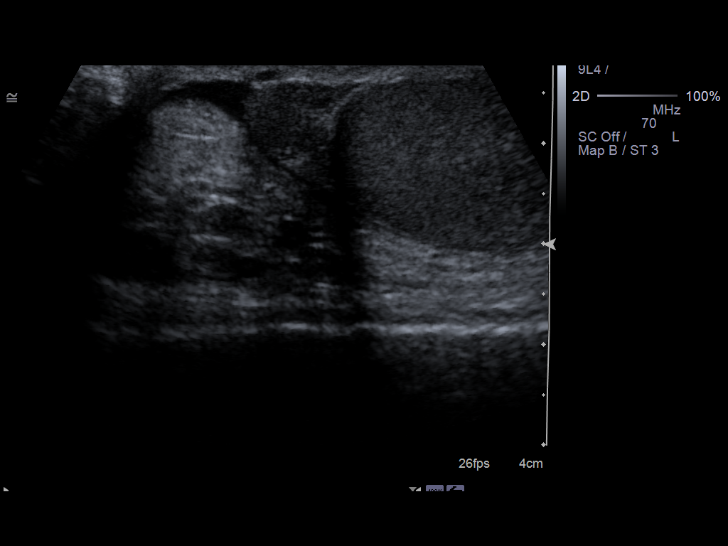
[im 34/51]
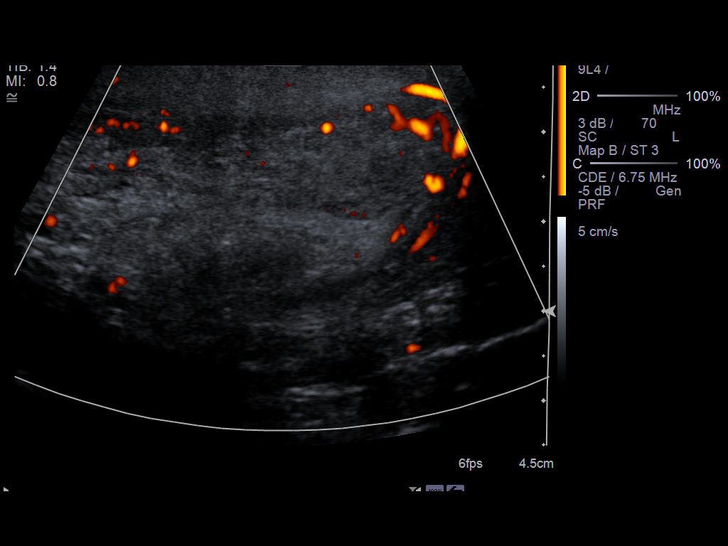
[im 38/51]
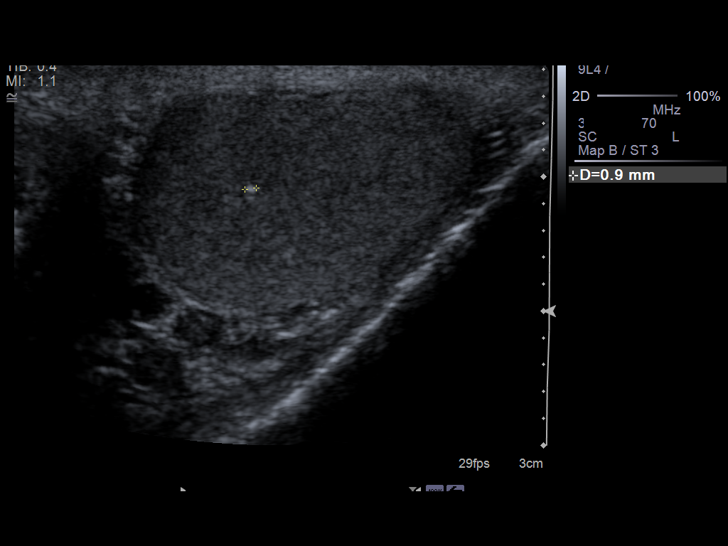
[im 42/51]
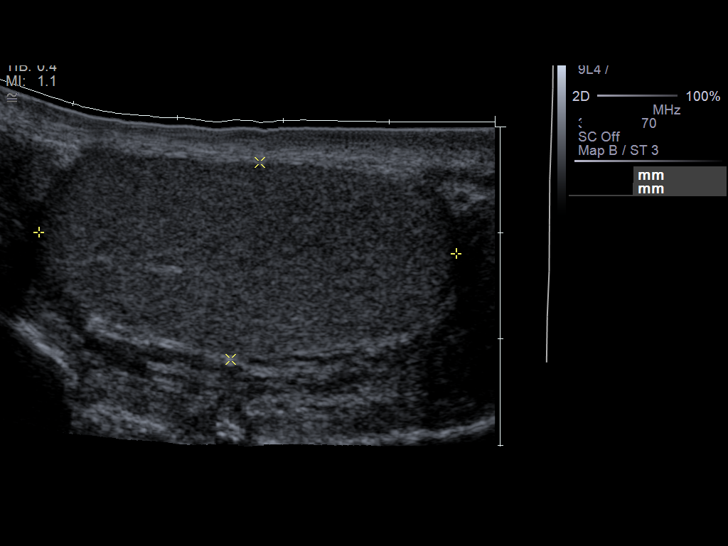
[im 46/51]
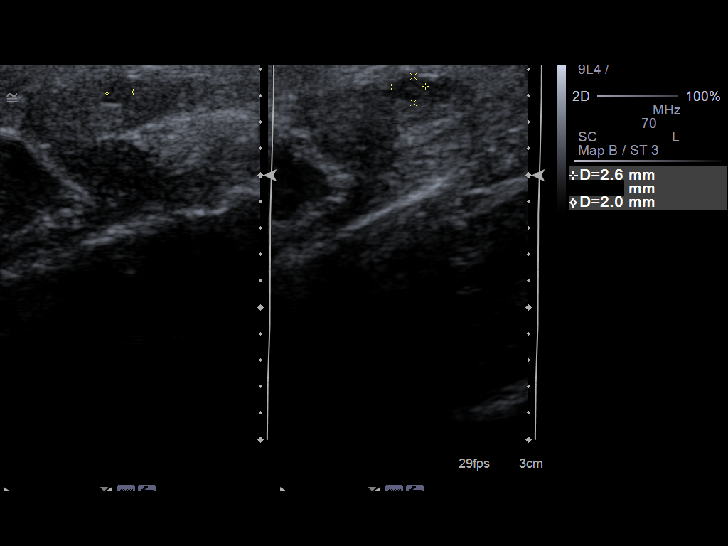
[im 51/51]
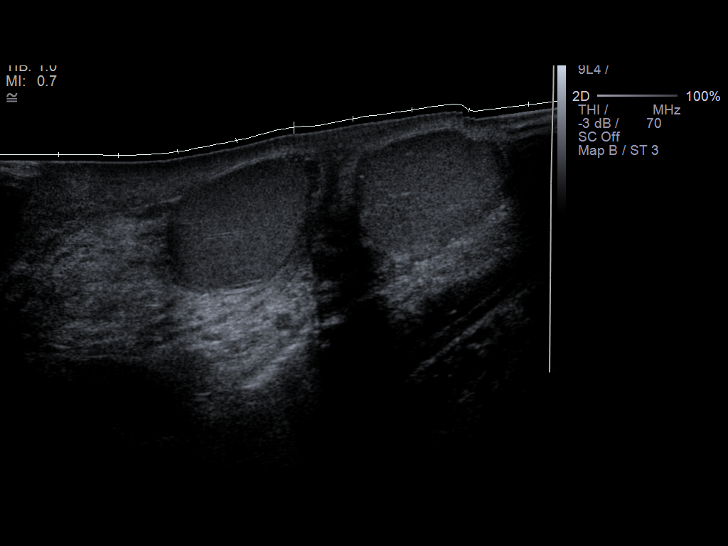

[13 of 25 positions shown; findings below may reference images not displayed]

FINDINGS: Right testis:  Normal, measuring 4.2 x 2.8 x 1.9 cm

Left testis: Single 1 mm central testicular calcification.
Otherwise, normal, measuring 3.9 x 2.8 x 1.9 cm

Right epididymis:  Normal in size and appearance.

Left epididymis: 3 mm oval hypoechoic area within the head of the
epididymis.

Hydrocele:  Absent

Varicocele:  Absent

Pulsed Doppler interrogation of both testes demonstrates low
resistance arterial and venous waveforms bilaterally.

There is heterogeneous, hypoechoic skin thickening and the area of
clinical concern in the scrotum on the right. This has prominent
internal blood flow with color Doppler with no discrete fluid
collection.
IMPRESSION: 1. Right scrotal skin cellulitis without a drainable abscess.
2. Minimal left testicular microlithiasis.
3. 3 mm possible developing epididymal cyst on the left.
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
## 2016-08-16 ENCOUNTER — Emergency Department (HOSPITAL_COMMUNITY)
Admission: EM | Admit: 2016-08-16 | Discharge: 2016-08-16 | Disposition: A | Discharge status: Home / Self Care | Payer: No Typology Code available for payment source | Attending: Emergency Medicine | Admitting: Emergency Medicine

## 2016-08-16 ENCOUNTER — Encounter (HOSPITAL_COMMUNITY): Payer: Self-pay | Admitting: Emergency Medicine

## 2016-08-16 DIAGNOSIS — Y92410 Unspecified street and highway as the place of occurrence of the external cause: Secondary | ICD-10-CM | POA: Insufficient documentation

## 2016-08-16 DIAGNOSIS — F17210 Nicotine dependence, cigarettes, uncomplicated: Secondary | ICD-10-CM | POA: Insufficient documentation

## 2016-08-16 DIAGNOSIS — S3992XA Unspecified injury of lower back, initial encounter: Secondary | ICD-10-CM | POA: Diagnosis present

## 2016-08-16 DIAGNOSIS — Y999 Unspecified external cause status: Secondary | ICD-10-CM | POA: Insufficient documentation

## 2016-08-16 DIAGNOSIS — S39012A Strain of muscle, fascia and tendon of lower back, initial encounter: Secondary | ICD-10-CM | POA: Insufficient documentation

## 2016-08-16 DIAGNOSIS — Y939 Activity, unspecified: Secondary | ICD-10-CM | POA: Diagnosis not present

## 2016-08-16 MED ORDER — HYDROCODONE-ACETAMINOPHEN 5-325 MG PO TABS: 1.0000 | ORAL_TABLET | Freq: Four times a day (QID) | ORAL | 0 refills | Status: DC | PRN

## 2016-08-16 MED ORDER — IBUPROFEN 800 MG PO TABS: 800.0000 mg | ORAL_TABLET | Freq: Three times a day (TID) | ORAL | 0 refills | Status: DC

## 2016-08-16 MED ORDER — CYCLOBENZAPRINE HCL 10 MG PO TABS: 10.0000 mg | ORAL_TABLET | Freq: Two times a day (BID) | ORAL | 0 refills | Status: DC | PRN
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
## 2017-07-02 ENCOUNTER — Encounter (HOSPITAL_COMMUNITY): Payer: Self-pay | Admitting: Emergency Medicine

## 2017-07-02 ENCOUNTER — Emergency Department (HOSPITAL_COMMUNITY)
Admission: EM | Admit: 2017-07-02 | Discharge: 2017-07-02 | Disposition: A | Discharge status: Home / Self Care | Payer: Self-pay | Attending: Emergency Medicine | Admitting: Emergency Medicine

## 2017-07-02 ENCOUNTER — Emergency Department (HOSPITAL_COMMUNITY): Payer: Self-pay

## 2017-07-02 DIAGNOSIS — W3400XA Accidental discharge from unspecified firearms or gun, initial encounter: Secondary | ICD-10-CM | POA: Insufficient documentation

## 2017-07-02 DIAGNOSIS — S0081XA Abrasion of other part of head, initial encounter: Secondary | ICD-10-CM

## 2017-07-02 DIAGNOSIS — Y998 Other external cause status: Secondary | ICD-10-CM | POA: Insufficient documentation

## 2017-07-02 DIAGNOSIS — S80212A Abrasion, left knee, initial encounter: Secondary | ICD-10-CM

## 2017-07-02 DIAGNOSIS — F17210 Nicotine dependence, cigarettes, uncomplicated: Secondary | ICD-10-CM | POA: Insufficient documentation

## 2017-07-02 DIAGNOSIS — Y929 Unspecified place or not applicable: Secondary | ICD-10-CM | POA: Insufficient documentation

## 2017-07-02 DIAGNOSIS — Y939 Activity, unspecified: Secondary | ICD-10-CM | POA: Insufficient documentation

## 2017-07-02 MED ORDER — CEPHALEXIN 500 MG PO CAPS: 500.0000 mg | ORAL_CAPSULE | Freq: Three times a day (TID) | ORAL | 0 refills | Status: DC

## 2017-07-02 MED ORDER — NAPROXEN 500 MG PO TABS: 500.0000 mg | ORAL_TABLET | Freq: Two times a day (BID) | ORAL | 0 refills | Status: DC

## 2017-07-02 MED ORDER — KETOROLAC TROMETHAMINE 60 MG/2ML IM SOLN
60.0000 mg | Freq: Once | INTRAMUSCULAR | Status: AC
  Administered 2017-07-02: 60 mg via INTRAMUSCULAR
  Filled 2017-07-02: qty 2

## 2017-07-02 NOTE — ED Notes (Signed)
ED Provider at bedside. 
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
## 2017-07-02 NOTE — ED Notes (Signed)
Patient verbalizes understanding of discharge instructions. Opportunity for questioning and answers were provided. Armband removed by staff, pt discharged from ED ambulatory.   

## 2017-07-02 NOTE — Discharge Instructions (Signed)
Ice and elevate your knee. Bacitracin twice a day to the wound. Naprosyn for pain. Follow up with family doctor as needed.
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
## 2017-11-04 ENCOUNTER — Encounter (HOSPITAL_COMMUNITY): Payer: Self-pay

## 2017-11-04 ENCOUNTER — Emergency Department (HOSPITAL_COMMUNITY)
Admission: EM | Admit: 2017-11-04 | Discharge: 2017-11-04 | Disposition: A | Discharge status: Home / Self Care | Payer: Self-pay | Attending: Emergency Medicine | Admitting: Emergency Medicine

## 2017-11-04 ENCOUNTER — Other Ambulatory Visit: Payer: Self-pay

## 2017-11-04 DIAGNOSIS — L509 Urticaria, unspecified: Secondary | ICD-10-CM | POA: Insufficient documentation

## 2017-11-04 DIAGNOSIS — T63441A Toxic effect of venom of bees, accidental (unintentional), initial encounter: Secondary | ICD-10-CM | POA: Insufficient documentation

## 2017-11-04 DIAGNOSIS — T7840XA Allergy, unspecified, initial encounter: Secondary | ICD-10-CM

## 2017-11-04 DIAGNOSIS — F17210 Nicotine dependence, cigarettes, uncomplicated: Secondary | ICD-10-CM | POA: Insufficient documentation

## 2017-11-04 MED ORDER — FAMOTIDINE 20 MG PO TABS
20.0000 mg | ORAL_TABLET | Freq: Once | ORAL | Status: AC
  Administered 2017-11-04: 20 mg via ORAL
  Filled 2017-11-04: qty 1

## 2017-11-04 MED ORDER — DIPHENHYDRAMINE HCL 50 MG/ML IJ SOLN: 50.0000 mg | Freq: Once | INTRAMUSCULAR | Status: DC

## 2017-11-04 MED ORDER — METHYLPREDNISOLONE SODIUM SUCC 125 MG IJ SOLR: 125.0000 mg | Freq: Once | INTRAMUSCULAR | Status: DC

## 2017-11-04 MED ORDER — FAMOTIDINE IN NACL 20-0.9 MG/50ML-% IV SOLN: 20.0000 mg | Freq: Once | INTRAVENOUS | Status: DC

## 2017-11-04 MED ORDER — DIPHENHYDRAMINE HCL 25 MG PO CAPS
50.0000 mg | ORAL_CAPSULE | Freq: Once | ORAL | Status: AC
  Administered 2017-11-04: 50 mg via ORAL
  Filled 2017-11-04: qty 2

## 2017-11-04 MED ORDER — PREDNISONE 20 MG PO TABS: 40.0000 mg | ORAL_TABLET | Freq: Every day | ORAL | 0 refills | Status: AC

## 2017-11-04 MED ORDER — PREDNISONE 20 MG PO TABS
60.0000 mg | ORAL_TABLET | Freq: Once | ORAL | Status: AC
  Administered 2017-11-04: 60 mg via ORAL
  Filled 2017-11-04: qty 3

## 2017-11-04 NOTE — Discharge Instructions (Signed)
Please take prednisone daily for the next 4 days.  He got your first dose in the ER today and do not need another dose until tomorrow.  You can take Benadryl as needed for further itching.  Return to the ER if you have any new or concerning symptoms like throat closing, face swelling or trouble breathing.
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
## 2017-11-04 NOTE — ED Triage Notes (Signed)
Pt stung by a bee x 1 hour ago and has known allergy to bee stings. Pt now hives all over the torso, denies shob. Airway intact. VSS.

## 2017-12-09 ENCOUNTER — Ambulatory Visit (HOSPITAL_COMMUNITY)
Admission: EM | Admit: 2017-12-09 | Discharge: 2017-12-09 | Disposition: A | Discharge status: Home / Self Care | Payer: Self-pay | Attending: Internal Medicine | Admitting: Internal Medicine

## 2017-12-09 ENCOUNTER — Encounter (HOSPITAL_COMMUNITY): Payer: Self-pay | Admitting: *Deleted

## 2017-12-09 DIAGNOSIS — Z202 Contact with and (suspected) exposure to infections with a predominantly sexual mode of transmission: Secondary | ICD-10-CM

## 2017-12-09 DIAGNOSIS — R300 Dysuria: Secondary | ICD-10-CM

## 2017-12-09 DIAGNOSIS — Z91030 Bee allergy status: Secondary | ICD-10-CM | POA: Insufficient documentation

## 2017-12-09 DIAGNOSIS — Z8249 Family history of ischemic heart disease and other diseases of the circulatory system: Secondary | ICD-10-CM | POA: Insufficient documentation

## 2017-12-09 DIAGNOSIS — Z113 Encounter for screening for infections with a predominantly sexual mode of transmission: Secondary | ICD-10-CM | POA: Insufficient documentation

## 2017-12-09 DIAGNOSIS — F17210 Nicotine dependence, cigarettes, uncomplicated: Secondary | ICD-10-CM | POA: Insufficient documentation

## 2017-12-09 DIAGNOSIS — N4889 Other specified disorders of penis: Secondary | ICD-10-CM

## 2017-12-09 LAB — POCT URINALYSIS DIP (DEVICE)
BILIRUBIN URINE: NEGATIVE
GLUCOSE, UA: NEGATIVE mg/dL
Hgb urine dipstick: NEGATIVE
Ketones, ur: NEGATIVE mg/dL
NITRITE: NEGATIVE
Protein, ur: NEGATIVE mg/dL
SPECIFIC GRAVITY, URINE: 1.02 (ref 1.005–1.030)
Urobilinogen, UA: 0.2 mg/dL (ref 0.0–1.0)
pH: 5.5 (ref 5.0–8.0)

## 2017-12-09 MED ORDER — AZITHROMYCIN 250 MG PO TABS
ORAL_TABLET | ORAL | Status: AC
  Filled 2017-12-09: qty 4

## 2017-12-09 MED ORDER — CEFTRIAXONE SODIUM 250 MG IJ SOLR
250.0000 mg | Freq: Once | INTRAMUSCULAR | Status: AC
  Administered 2017-12-09: 250 mg via INTRAMUSCULAR

## 2017-12-09 MED ORDER — AZITHROMYCIN 250 MG PO TABS
1000.0000 mg | ORAL_TABLET | Freq: Once | ORAL | Status: AC
  Administered 2017-12-09: 1000 mg via ORAL

## 2017-12-09 MED ORDER — LIDOCAINE HCL (PF) 1 % IJ SOLN
INTRAMUSCULAR | Status: AC
  Filled 2017-12-09: qty 2

## 2017-12-09 MED ORDER — CEFTRIAXONE SODIUM 250 MG IJ SOLR
INTRAMUSCULAR | Status: AC
  Filled 2017-12-09: qty 250
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
## 2017-12-09 NOTE — ED Triage Notes (Signed)
Pt states that his partner was diagnosed with Bacterial Vaginitis and that now he is having genital symptoms. Pt states he is having genital irritation/tingling and dysuria that started four days ago.

## 2017-12-09 NOTE — Discharge Instructions (Addendum)
I will send your urine off for urine culture to rule out an urinary tract infection. Also will test your urine for gonorrhea, chlamydia and trichomoniasis. It will take a few days for results to come back, we will call you.   Today you have received Rocephin 250 mg and Azithromycin 1000 mg as the STD treatment.

## 2017-12-11 LAB — URINE CYTOLOGY ANCILLARY ONLY
Chlamydia: NEGATIVE
Neisseria Gonorrhea: POSITIVE — AB
TRICH (WINDOWPATH): NEGATIVE

## 2017-12-12 ENCOUNTER — Telehealth (HOSPITAL_COMMUNITY): Payer: Self-pay

## 2017-12-12 NOTE — Telephone Encounter (Signed)
Test for gonorrhea was positive. This was treated at the urgent care visit with IM rocephin 250mg and po zithromax 1g. Pt called regarding test results, instructed patient to refrain from sexual intercourse for 7 days after treatment to give the medicine time to work. Sexual partners need to be notified and tested/treated. Condoms may reduce risk of reinfection. Recheck or followup with PCP for further evaluation if symptoms are not improving. Answered all patient questions. [facility] notified.  

## 2018-03-03 ENCOUNTER — Emergency Department (HOSPITAL_COMMUNITY)
Admission: EM | Admit: 2018-03-03 | Discharge: 2018-03-03 | Disposition: A | Discharge status: Home / Self Care | Payer: BLUE CROSS/BLUE SHIELD | Attending: Emergency Medicine | Admitting: Emergency Medicine

## 2018-03-03 ENCOUNTER — Emergency Department (HOSPITAL_COMMUNITY): Payer: BLUE CROSS/BLUE SHIELD

## 2018-03-03 ENCOUNTER — Other Ambulatory Visit: Payer: Self-pay

## 2018-03-03 ENCOUNTER — Encounter (HOSPITAL_COMMUNITY): Payer: Self-pay | Admitting: *Deleted

## 2018-03-03 DIAGNOSIS — R4182 Altered mental status, unspecified: Secondary | ICD-10-CM | POA: Diagnosis not present

## 2018-03-03 DIAGNOSIS — S300XXA Contusion of lower back and pelvis, initial encounter: Secondary | ICD-10-CM | POA: Diagnosis not present

## 2018-03-03 DIAGNOSIS — R9389 Abnormal findings on diagnostic imaging of other specified body structures: Secondary | ICD-10-CM | POA: Diagnosis not present

## 2018-03-03 DIAGNOSIS — R51 Headache: Secondary | ICD-10-CM | POA: Insufficient documentation

## 2018-03-03 DIAGNOSIS — Y939 Activity, unspecified: Secondary | ICD-10-CM | POA: Diagnosis not present

## 2018-03-03 DIAGNOSIS — Y929 Unspecified place or not applicable: Secondary | ICD-10-CM | POA: Diagnosis not present

## 2018-03-03 DIAGNOSIS — S73004A Unspecified dislocation of right hip, initial encounter: Secondary | ICD-10-CM | POA: Insufficient documentation

## 2018-03-03 DIAGNOSIS — F17210 Nicotine dependence, cigarettes, uncomplicated: Secondary | ICD-10-CM | POA: Insufficient documentation

## 2018-03-03 DIAGNOSIS — T148XXA Other injury of unspecified body region, initial encounter: Secondary | ICD-10-CM

## 2018-03-03 DIAGNOSIS — Y999 Unspecified external cause status: Secondary | ICD-10-CM | POA: Insufficient documentation

## 2018-03-03 DIAGNOSIS — M25551 Pain in right hip: Secondary | ICD-10-CM | POA: Diagnosis present

## 2018-03-03 LAB — I-STAT CHEM 8, ED
BUN: 8 mg/dL (ref 6–20)
Calcium, Ion: 1.07 mmol/L — ABNORMAL LOW (ref 1.15–1.40)
Chloride: 102 mmol/L (ref 98–111)
Creatinine, Ser: 1 mg/dL (ref 0.61–1.24)
Glucose, Bld: 102 mg/dL — ABNORMAL HIGH (ref 70–99)
HEMATOCRIT: 44 % (ref 39.0–52.0)
HEMOGLOBIN: 15 g/dL (ref 13.0–17.0)
POTASSIUM: 3.6 mmol/L (ref 3.5–5.1)
Sodium: 138 mmol/L (ref 135–145)
TCO2: 24 mmol/L (ref 22–32)

## 2018-03-03 LAB — CBC WITH DIFFERENTIAL/PLATELET
ABS IMMATURE GRANULOCYTES: 0.04 10*3/uL (ref 0.00–0.07)
BASOS PCT: 0 %
Basophils Absolute: 0 10*3/uL (ref 0.0–0.1)
EOS ABS: 0 10*3/uL (ref 0.0–0.5)
Eosinophils Relative: 0 %
HCT: 41.7 % (ref 39.0–52.0)
Hemoglobin: 13.5 g/dL (ref 13.0–17.0)
IMMATURE GRANULOCYTES: 0 %
Lymphocytes Relative: 10 %
Lymphs Abs: 1.2 10*3/uL (ref 0.7–4.0)
MCH: 30 pg (ref 26.0–34.0)
MCHC: 32.4 g/dL (ref 30.0–36.0)
MCV: 92.7 fL (ref 80.0–100.0)
MONO ABS: 1.2 10*3/uL — AB (ref 0.1–1.0)
Monocytes Relative: 10 %
NEUTROS ABS: 10.3 10*3/uL — AB (ref 1.7–7.7)
NEUTROS PCT: 80 %
PLATELETS: 188 10*3/uL (ref 150–400)
RBC: 4.5 MIL/uL (ref 4.22–5.81)
RDW: 14.1 % (ref 11.5–15.5)
WBC: 12.8 10*3/uL — AB (ref 4.0–10.5)
nRBC: 0 % (ref 0.0–0.2)

## 2018-03-03 MED ORDER — IOPAMIDOL (ISOVUE-370) INJECTION 76%
INTRAVENOUS | Status: AC
  Filled 2018-03-03: qty 100

## 2018-03-03 MED ORDER — FENTANYL CITRATE (PF) 100 MCG/2ML IJ SOLN
75.0000 ug | Freq: Once | INTRAMUSCULAR | Status: AC
  Administered 2018-03-03: 75 ug via INTRAVENOUS
  Filled 2018-03-03: qty 2

## 2018-03-03 MED ORDER — KETAMINE HCL 10 MG/ML IJ SOLN
INTRAMUSCULAR | Status: AC | PRN
  Administered 2018-03-03: 20 mg via INTRAVENOUS
  Administered 2018-03-03: 40 mg via INTRAVENOUS

## 2018-03-03 MED ORDER — MORPHINE SULFATE (PF) 4 MG/ML IV SOLN
4.0000 mg | Freq: Once | INTRAVENOUS | Status: AC
  Administered 2018-03-03: 4 mg via INTRAVENOUS
  Filled 2018-03-03: qty 1

## 2018-03-03 MED ORDER — PROPOFOL 10 MG/ML IV BOLUS
INTRAVENOUS | Status: AC | PRN
  Administered 2018-03-03: 20 mg via INTRAVENOUS
  Administered 2018-03-03: 40 mg via INTRAVENOUS

## 2018-03-03 MED ORDER — OXYCODONE-ACETAMINOPHEN 5-325 MG PO TABS: ORAL_TABLET | ORAL | 0 refills | Status: DC

## 2018-03-03 MED ORDER — IOPAMIDOL (ISOVUE-370) INJECTION 76%
100.0000 mL | Freq: Once | INTRAVENOUS | Status: AC | PRN
  Administered 2018-03-03: 100 mL via INTRAVENOUS

## 2018-03-03 MED ORDER — PROPOFOL 10 MG/ML IV BOLUS
100.0000 mg | Freq: Once | INTRAVENOUS | Status: AC
  Administered 2018-03-03: 60 mg via INTRAVENOUS
  Filled 2018-03-03: qty 20

## 2018-03-03 MED ORDER — OXYCODONE-ACETAMINOPHEN 5-325 MG PO TABS
2.0000 | ORAL_TABLET | Freq: Once | ORAL | Status: AC
  Administered 2018-03-03: 2 via ORAL
  Filled 2018-03-03: qty 2

## 2018-03-03 MED ORDER — SODIUM CHLORIDE 0.9 % IJ SOLN
INTRAMUSCULAR | Status: AC
  Filled 2018-03-03: qty 50

## 2018-03-03 MED ORDER — METHOCARBAMOL 500 MG PO TABS: 1000.0000 mg | ORAL_TABLET | Freq: Four times a day (QID) | ORAL | 0 refills | Status: DC | PRN

## 2018-03-03 MED ORDER — KETAMINE HCL 50 MG/5ML IJ SOSY
1.0000 mg/kg | PREFILLED_SYRINGE | Freq: Once | INTRAMUSCULAR | Status: AC
  Administered 2018-03-03: 60 mg via INTRAVENOUS
  Filled 2018-03-03: qty 10

## 2018-03-03 NOTE — Discharge Instructions (Signed)
Take the prescriptions as directed.  Apply moist heat or ice to the area(s) of discomfort, for 15 minutes at a time, several times per day for the next few days.  Do not fall asleep on a heating or ice pack. Use the crutches and wear the knee immobilizer and ankle/foot splint until you are seen in follow up.  Call the Orthopedic doctor on Monday to schedule a follow up appointment this week.  Return to the Emergency Department immediately if worsening.
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
## 2018-03-03 NOTE — ED Notes (Signed)
Bed: LK44 Expected date:  Expected time:  Means of arrival:  Comments: Hallway b
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
## 2018-03-03 NOTE — ED Notes (Signed)
Patient transported to X-ray 
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
## 2019-03-06 ENCOUNTER — Emergency Department (HOSPITAL_COMMUNITY)
Admission: EM | Admit: 2019-03-06 | Discharge: 2019-03-07 | Disposition: A | Discharge status: Home / Self Care | Payer: BC Managed Care – PPO | Attending: Emergency Medicine | Admitting: Emergency Medicine

## 2019-03-06 ENCOUNTER — Encounter (HOSPITAL_COMMUNITY): Payer: Self-pay | Admitting: Emergency Medicine

## 2019-03-06 ENCOUNTER — Other Ambulatory Visit: Payer: Self-pay

## 2019-03-06 DIAGNOSIS — H9191 Unspecified hearing loss, right ear: Secondary | ICD-10-CM

## 2019-03-06 DIAGNOSIS — F17210 Nicotine dependence, cigarettes, uncomplicated: Secondary | ICD-10-CM | POA: Diagnosis not present

## 2019-03-06 DIAGNOSIS — Z20828 Contact with and (suspected) exposure to other viral communicable diseases: Secondary | ICD-10-CM | POA: Diagnosis present

## 2019-03-06 DIAGNOSIS — Z20822 Contact with and (suspected) exposure to covid-19: Secondary | ICD-10-CM

## 2019-03-06 DIAGNOSIS — R05 Cough: Secondary | ICD-10-CM | POA: Insufficient documentation

## 2019-03-06 NOTE — Discharge Instructions (Signed)
Person Under Monitoring Name: Micheal Schmidt  Location: [place] [address]   Infection Prevention Recommendations for Individuals Confirmed to have, or Being Evaluated for, 2019 Novel Coronavirus (COVID-19) Infection Who Receive Care at Home  Individuals who are confirmed to have, or are being evaluated for, COVID-19 should follow the prevention steps below until a healthcare provider or local or state health department says they can return to normal activities.  Stay home except to get medical care You should restrict activities outside your home, except for getting medical care. Do not go to work, school, or public areas, and do not use public transportation or taxis.  Call ahead before visiting your doctor Before your medical appointment, call the healthcare provider and tell them that you have, or are being evaluated for, COVID-19 infection. This will help the healthcare providers office take steps to keep other people from getting infected. Ask your healthcare provider to call the local or state health department.  Monitor your symptoms Seek prompt medical attention if your illness is worsening (e.g., difficulty breathing). Before going to your medical appointment, call the healthcare provider and tell them that you have, or are being evaluated for, COVID-19 infection. Ask your healthcare provider to call the local or state health department.  Wear a facemask You should wear a facemask that covers your nose and mouth when you are in the same room with other people and when you visit a healthcare provider. People who live with or visit you should also wear a facemask while they are in the same room with you.  Separate yourself from other people in your home As much as possible, you should stay in a different room from other people in your home. Also, you should use a separate bathroom, if available.  Avoid sharing household items You should not share  dishes, drinking glasses, cups, eating utensils, towels, bedding, or other items with other people in your home. After using these items, you should wash them thoroughly with soap and water.  Cover your coughs and sneezes Cover your mouth and nose with a tissue when you cough or sneeze, or you can cough or sneeze into your sleeve. Throw used tissues in a lined trash can, and immediately wash your hands with soap and water for at least 20 seconds or use an alcohol-based hand rub.  Wash your [facility] your hands often and thoroughly with soap and water for at least 20 seconds. You can use an alcohol-based hand sanitizer if soap and water are not available and if your hands are not visibly dirty. Avoid touching your eyes, nose, and mouth with unwashed hands.   Prevention Steps for Caregivers and Household Members of Individuals Confirmed to have, or Being Evaluated for, COVID-19 Infection Being Cared for in the Home  If you live with, or provide care at home for, a person confirmed to have, or being evaluated for, COVID-19 infection please follow these guidelines to prevent infection:  Follow healthcare providers instructions Make sure that you understand and can help the patient follow any healthcare provider instructions for all care.  Provide for the patients basic needs You should help the patient with basic needs in the home and provide support for getting groceries, prescriptions, and other personal needs.  Monitor the patients symptoms If they are getting sicker, call his or her medical provider and tell them that the patient has, or is being evaluated for, COVID-19 infection. This will help the healthcare providers office take  steps to keep other people from getting infected. Ask the healthcare provider to call the local or state health department.  Limit the number of people who have contact with the patient If possible, have only one caregiver for the patient. Other  household members should stay in another home or place of residence. If this is not possible, they should stay in another room, or be separated from the patient as much as possible. Use a separate bathroom, if available. Restrict visitors who do not have an essential need to be in the home.  Keep older adults, very young children, and other sick people away from the patient Keep older adults, very young children, and those who have compromised immune systems or chronic health conditions away from the patient. This includes people with chronic heart, lung, or kidney conditions, diabetes, and cancer.  Ensure good ventilation Make sure that shared spaces in the home have good air flow, such as from an air conditioner or an opened window, weather permitting.  Wash your hands often Wash your hands often and thoroughly with soap and water for at least 20 seconds. You can use an alcohol based hand sanitizer if soap and water are not available and if your hands are not visibly dirty. Avoid touching your eyes, nose, and mouth with unwashed hands. Use disposable paper towels to dry your hands. If not available, use dedicated cloth towels and replace them when they become wet.  Wear a facemask and gloves Wear a disposable facemask at all times in the room and gloves when you touch or have contact with the patients blood, body fluids, and/or secretions or excretions, such as sweat, saliva, sputum, nasal mucus, vomit, urine, or feces.  Ensure the mask fits over your nose and mouth tightly, and do not touch it during use. Throw out disposable facemasks and gloves after using them. Do not reuse. Wash your hands immediately after removing your facemask and gloves. If your personal clothing becomes contaminated, carefully remove clothing and launder. Wash your hands after handling contaminated clothing. Place all used disposable facemasks, gloves, and other waste in a lined container before disposing them with  other household waste. Remove gloves and wash your hands immediately after handling these items.  Do not share dishes, glasses, or other household items with the patient Avoid sharing household items. You should not share dishes, drinking glasses, cups, eating utensils, towels, bedding, or other items with a patient who is confirmed to have, or being evaluated for, COVID-19 infection. After the person uses these items, you should wash them thoroughly with soap and water.  Wash laundry thoroughly Immediately remove and wash clothes or bedding that have blood, body fluids, and/or secretions or excretions, such as sweat, saliva, sputum, nasal mucus, vomit, urine, or feces, on them. Wear gloves when handling laundry from the patient. Read and follow directions on labels of laundry or clothing items and detergent. In general, wash and dry with the warmest temperatures recommended on the label.  Clean all areas the individual has used often Clean all touchable surfaces, such as counters, tabletops, doorknobs, bathroom fixtures, toilets, phones, keyboards, tablets, and bedside tables, every day. Also, clean any surfaces that may have blood, body fluids, and/or secretions or excretions on them. Wear gloves when cleaning surfaces the patient has come in contact with. Use a diluted bleach solution (e.g., dilute bleach with 1 part bleach and 10 parts water) or a household disinfectant with a label that says EPA-registered for coronaviruses. To make a bleach solution  at home, add 1 tablespoon of bleach to 1 quart (4 cups) of water. For a larger supply, add  cup of bleach to 1 gallon (16 cups) of water. Read labels of cleaning products and follow recommendations provided on product labels. Labels contain instructions for safe and effective use of the cleaning product including precautions you should take when applying the product, such as wearing gloves or eye protection and making sure you have good ventilation  during use of the product. Remove gloves and wash hands immediately after cleaning.  Monitor yourself for signs and symptoms of illness Caregivers and household members are considered close contacts, should monitor their health, and will be asked to limit movement outside of the home to the extent possible. Follow the monitoring steps for close contacts listed on the symptom monitoring form.   ? If you have additional questions, contact your local health department or call the epidemiologist on call at [phone] (available 24/7). ? This guidance is subject to change. For the most up-to-date guidance from [facility], please refer to their website: [email or web address]
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
## 2019-03-06 NOTE — ED Triage Notes (Signed)
Patient requesting Covid test, patient stated his wife was exposed to a person with positive Covid test , denies any symptoms / no fever or respiratory symptoms .

## 2019-03-07 NOTE — ED Notes (Signed)
Patient verbalizes understanding of discharge instructions. Opportunity for questioning and answers were provided. Armband removed by staff, pt discharged from ED ambulatory by self\  

## 2019-03-08 LAB — NOVEL CORONAVIRUS, NAA (HOSP ORDER, SEND-OUT TO REF LAB; TAT 18-24 HRS): SARS-CoV-2, NAA: NOT DETECTED

## 2019-04-29 ENCOUNTER — Emergency Department (HOSPITAL_COMMUNITY)
Admission: EM | Admit: 2019-04-29 | Discharge: 2019-04-30 | Disposition: A | Discharge status: Home / Self Care | Payer: BC Managed Care – PPO | Attending: Emergency Medicine | Admitting: Emergency Medicine

## 2019-04-29 ENCOUNTER — Other Ambulatory Visit: Payer: Self-pay

## 2019-04-29 ENCOUNTER — Encounter (HOSPITAL_COMMUNITY): Payer: Self-pay

## 2019-04-29 DIAGNOSIS — Z79899 Other long term (current) drug therapy: Secondary | ICD-10-CM | POA: Insufficient documentation

## 2019-04-29 DIAGNOSIS — F17210 Nicotine dependence, cigarettes, uncomplicated: Secondary | ICD-10-CM | POA: Insufficient documentation

## 2019-04-29 DIAGNOSIS — L02412 Cutaneous abscess of left axilla: Secondary | ICD-10-CM | POA: Diagnosis present

## 2019-04-29 MED ORDER — LIDOCAINE-EPINEPHRINE (PF) 2 %-1:200000 IJ SOLN
20.0000 mL | Freq: Once | INTRAMUSCULAR | Status: AC
  Administered 2019-04-29: 20 mL
  Filled 2019-04-29: qty 20

## 2019-04-29 NOTE — ED Triage Notes (Signed)
Pt arrives POV for eval of underarm abscess, now approx golf ball size. Pt reports hx of same, onset 1 week go
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
## 2019-04-30 NOTE — Discharge Instructions (Signed)
You may remove the packing in 2 days.  Continue taking the Doxycycline prescribed by your work nurse.  Return to the ER if your symptoms worsen.

## 2019-07-22 ENCOUNTER — Other Ambulatory Visit: Payer: Self-pay

## 2019-07-22 ENCOUNTER — Observation Stay (HOSPITAL_COMMUNITY)
Admission: EM | Admit: 2019-07-22 | Discharge: 2019-07-23 | Disposition: A | Discharge status: Home / Self Care | Payer: Worker's Compensation | Attending: Surgery | Admitting: Surgery

## 2019-07-22 ENCOUNTER — Emergency Department (HOSPITAL_COMMUNITY): Payer: Worker's Compensation

## 2019-07-22 ENCOUNTER — Encounter (HOSPITAL_COMMUNITY): Payer: Self-pay

## 2019-07-22 DIAGNOSIS — Z20822 Contact with and (suspected) exposure to covid-19: Secondary | ICD-10-CM | POA: Diagnosis not present

## 2019-07-22 DIAGNOSIS — F17200 Nicotine dependence, unspecified, uncomplicated: Secondary | ICD-10-CM | POA: Insufficient documentation

## 2019-07-22 DIAGNOSIS — S20319A Abrasion of unspecified front wall of thorax, initial encounter: Secondary | ICD-10-CM | POA: Diagnosis not present

## 2019-07-22 DIAGNOSIS — S2239XA Fracture of one rib, unspecified side, initial encounter for closed fracture: Secondary | ICD-10-CM | POA: Diagnosis present

## 2019-07-22 DIAGNOSIS — W3182XA Contact with other commercial machinery, initial encounter: Secondary | ICD-10-CM | POA: Diagnosis not present

## 2019-07-22 DIAGNOSIS — Z91030 Bee allergy status: Secondary | ICD-10-CM | POA: Diagnosis not present

## 2019-07-22 DIAGNOSIS — S2249XA Multiple fractures of ribs, unspecified side, initial encounter for closed fracture: Secondary | ICD-10-CM | POA: Diagnosis present

## 2019-07-22 DIAGNOSIS — S2232XA Fracture of one rib, left side, initial encounter for closed fracture: Principal | ICD-10-CM | POA: Insufficient documentation

## 2019-07-22 DIAGNOSIS — Z87892 Personal history of anaphylaxis: Secondary | ICD-10-CM | POA: Insufficient documentation

## 2019-07-22 DIAGNOSIS — Y990 Civilian activity done for income or pay: Secondary | ICD-10-CM | POA: Insufficient documentation

## 2019-07-22 HISTORY — DX: Other specified health status: Z78.9

## 2019-07-22 LAB — CBC
HCT: 42.1 % (ref 39.0–52.0)
Hemoglobin: 13.8 g/dL (ref 13.0–17.0)
MCH: 30.1 pg (ref 26.0–34.0)
MCHC: 32.8 g/dL (ref 30.0–36.0)
MCV: 91.9 fL (ref 80.0–100.0)
Platelets: 264 10*3/uL (ref 150–400)
RBC: 4.58 MIL/uL (ref 4.22–5.81)
RDW: 14.3 % (ref 11.5–15.5)
WBC: 9.5 10*3/uL (ref 4.0–10.5)
nRBC: 0 % (ref 0.0–0.2)

## 2019-07-22 LAB — COMPREHENSIVE METABOLIC PANEL
ALT: 20 U/L (ref 0–44)
AST: 23 U/L (ref 15–41)
Albumin: 3.6 g/dL (ref 3.5–5.0)
Alkaline Phosphatase: 60 U/L (ref 38–126)
Anion gap: 8 (ref 5–15)
BUN: 13 mg/dL (ref 6–20)
CO2: 24 mmol/L (ref 22–32)
Calcium: 8.9 mg/dL (ref 8.9–10.3)
Chloride: 108 mmol/L (ref 98–111)
Creatinine, Ser: 0.99 mg/dL (ref 0.61–1.24)
GFR calc Af Amer: >60 mL/min (ref >60)
GFR calc non Af Amer: >60 mL/min (ref >60)
Glucose, Bld: 172 mg/dL — ABNORMAL HIGH (ref 70–99)
Potassium: 4.1 mmol/L (ref 3.5–5.1)
Sodium: 140 mmol/L (ref 135–145)
Total Bilirubin: 0.3 mg/dL (ref 0.3–1.2)
Total Protein: 6.8 g/dL (ref 6.5–8.1)

## 2019-07-22 LAB — I-STAT CHEM 8, ED
BUN: 16 mg/dL (ref 6–20)
Calcium, Ion: 1.16 mmol/L (ref 1.15–1.40)
Chloride: 107 mmol/L (ref 98–111)
Creatinine, Ser: 0.8 mg/dL (ref 0.61–1.24)
Glucose, Bld: 159 mg/dL — ABNORMAL HIGH (ref 70–99)
HCT: 43 % (ref 39.0–52.0)
Hemoglobin: 14.6 g/dL (ref 13.0–17.0)
Potassium: 4 mmol/L (ref 3.5–5.1)
Sodium: 141 mmol/L (ref 135–145)
TCO2: 27 mmol/L (ref 22–32)

## 2019-07-22 LAB — PROTIME-INR
INR: 0.9 (ref 0.8–1.2)
Prothrombin Time: 12 seconds (ref 11.4–15.2)

## 2019-07-22 LAB — SAMPLE TO BLOOD BANK

## 2019-07-22 LAB — ETHANOL: Alcohol, Ethyl (B): <10 mg/dL (ref <10)

## 2019-07-22 LAB — LACTIC ACID, PLASMA: Lactic Acid, Venous: 1.3 mmol/L (ref 0.5–1.9)

## 2019-07-22 MED ORDER — HYDRALAZINE HCL 20 MG/ML IJ SOLN: 10.0000 mg | INTRAMUSCULAR | Status: DC | PRN

## 2019-07-22 MED ORDER — ENOXAPARIN SODIUM 40 MG/0.4ML ~~LOC~~ SOLN
40.0000 mg | SUBCUTANEOUS | Status: DC
  Administered 2019-07-23: 40 mg via SUBCUTANEOUS
  Filled 2019-07-22: qty 0.4

## 2019-07-22 MED ORDER — BISACODYL 10 MG RE SUPP: 10.0000 mg | Freq: Every day | RECTAL | Status: DC | PRN

## 2019-07-22 MED ORDER — ONDANSETRON 4 MG PO TBDP: 4.0000 mg | ORAL_TABLET | Freq: Four times a day (QID) | ORAL | Status: DC | PRN

## 2019-07-22 MED ORDER — HYDROMORPHONE HCL 1 MG/ML IJ SOLN
1.0000 mg | Freq: Once | INTRAMUSCULAR | Status: AC
  Administered 2019-07-22: 1 mg via INTRAVENOUS

## 2019-07-22 MED ORDER — ONDANSETRON HCL 4 MG/2ML IJ SOLN: 4.0000 mg | Freq: Four times a day (QID) | INTRAMUSCULAR | Status: DC | PRN

## 2019-07-22 MED ORDER — FENTANYL CITRATE (PF) 100 MCG/2ML IJ SOLN
INTRAMUSCULAR | Status: AC
  Administered 2019-07-22: 50 ug
  Filled 2019-07-22: qty 2

## 2019-07-22 MED ORDER — ACETAMINOPHEN 500 MG PO TABS
1000.0000 mg | ORAL_TABLET | Freq: Four times a day (QID) | ORAL | Status: DC
  Administered 2019-07-23 (×3): 1000 mg via ORAL
  Filled 2019-07-22 (×3): qty 2

## 2019-07-22 MED ORDER — HYDROMORPHONE HCL 1 MG/ML IJ SOLN
INTRAMUSCULAR | Status: AC
  Filled 2019-07-22: qty 1

## 2019-07-22 MED ORDER — IBUPROFEN 200 MG PO TABS
800.0000 mg | ORAL_TABLET | Freq: Three times a day (TID) | ORAL | Status: DC
  Administered 2019-07-22 – 2019-07-23 (×2): 800 mg via ORAL
  Filled 2019-07-22 (×2): qty 4

## 2019-07-22 MED ORDER — HYDROMORPHONE HCL 1 MG/ML IJ SOLN
0.5000 mg | INTRAMUSCULAR | Status: DC | PRN
  Administered 2019-07-22 – 2019-07-23 (×2): 0.5 mg via INTRAVENOUS
  Filled 2019-07-22 (×2): qty 1

## 2019-07-22 MED ORDER — ACETAMINOPHEN 325 MG PO TABS: 650.0000 mg | ORAL_TABLET | Freq: Four times a day (QID) | ORAL | Status: DC

## 2019-07-22 MED ORDER — OXYCODONE HCL 5 MG PO TABS
5.0000 mg | ORAL_TABLET | ORAL | Status: DC | PRN
  Administered 2019-07-23 (×2): 5 mg via ORAL
  Filled 2019-07-22 (×2): qty 1

## 2019-07-22 MED ORDER — IOHEXOL 300 MG/ML  SOLN
100.0000 mL | Freq: Once | INTRAMUSCULAR | Status: AC | PRN
  Administered 2019-07-22: 100 mL via INTRAVENOUS

## 2019-07-22 MED ORDER — GABAPENTIN 300 MG PO CAPS
300.0000 mg | ORAL_CAPSULE | Freq: Three times a day (TID) | ORAL | Status: DC
  Administered 2019-07-22: 300 mg via ORAL
  Filled 2019-07-22: qty 1

## 2019-07-22 MED ORDER — DOCUSATE SODIUM 100 MG PO CAPS
100.0000 mg | ORAL_CAPSULE | Freq: Two times a day (BID) | ORAL | Status: DC
  Administered 2019-07-22: 100 mg via ORAL
  Filled 2019-07-22: qty 1

## 2019-07-22 MED ORDER — METHOCARBAMOL 500 MG PO TABS
500.0000 mg | ORAL_TABLET | Freq: Four times a day (QID) | ORAL | Status: DC | PRN
  Administered 2019-07-23: 500 mg via ORAL
  Filled 2019-07-22: qty 1

## 2019-07-22 MED ORDER — METOPROLOL TARTRATE 5 MG/5ML IV SOLN: 5.0000 mg | Freq: Four times a day (QID) | INTRAVENOUS | Status: DC | PRN

## 2019-07-22 NOTE — Progress Notes (Signed)
Orthopedic Tech Progress Note Patient Details:  Micheal Schmidt 12-23-1985 [id number] TRAUMA LEVEL 1 Patient ID: [name], male   DOB: 04/04/1986, 34 y.o.   MRN: [id number]   Ancil Linsey 07/22/2019, 8:36 PM
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
## 2019-07-23 ENCOUNTER — Encounter (HOSPITAL_COMMUNITY): Payer: Self-pay

## 2019-07-23 ENCOUNTER — Observation Stay (HOSPITAL_COMMUNITY): Payer: Self-pay | Attending: Surgery

## 2019-07-23 DIAGNOSIS — Y939 Activity, unspecified: Secondary | ICD-10-CM | POA: Insufficient documentation

## 2019-07-23 DIAGNOSIS — Y929 Unspecified place or not applicable: Secondary | ICD-10-CM | POA: Insufficient documentation

## 2019-07-23 DIAGNOSIS — Y999 Unspecified external cause status: Secondary | ICD-10-CM | POA: Insufficient documentation

## 2019-07-23 DIAGNOSIS — S2239XA Fracture of one rib, unspecified side, initial encounter for closed fracture: Secondary | ICD-10-CM | POA: Insufficient documentation

## 2019-07-23 LAB — CBC
HCT: 38.9 % — ABNORMAL LOW (ref 39.0–52.0)
Hemoglobin: 13.1 g/dL (ref 13.0–17.0)
MCH: 30.8 pg (ref 26.0–34.0)
MCHC: 33.7 g/dL (ref 30.0–36.0)
MCV: 91.3 fL (ref 80.0–100.0)
Platelets: 235 10*3/uL (ref 150–400)
RBC: 4.26 MIL/uL (ref 4.22–5.81)
RDW: 14.5 % (ref 11.5–15.5)
WBC: 11.7 10*3/uL — ABNORMAL HIGH (ref 4.0–10.5)
nRBC: 0 % (ref 0.0–0.2)

## 2019-07-23 LAB — BASIC METABOLIC PANEL
Anion gap: 10 (ref 5–15)
BUN: 10 mg/dL (ref 6–20)
CO2: 24 mmol/L (ref 22–32)
Calcium: 9 mg/dL (ref 8.9–10.3)
Chloride: 105 mmol/L (ref 98–111)
Creatinine, Ser: 0.83 mg/dL (ref 0.61–1.24)
GFR calc Af Amer: >60 mL/min (ref >60)
GFR calc non Af Amer: >60 mL/min (ref >60)
Glucose, Bld: 83 mg/dL (ref 70–99)
Potassium: 3.7 mmol/L (ref 3.5–5.1)
Sodium: 139 mmol/L (ref 135–145)

## 2019-07-23 LAB — SARS CORONAVIRUS 2 (TAT 6-24 HRS): SARS Coronavirus 2: NEGATIVE

## 2019-07-23 LAB — HEMOGLOBIN A1C
Hgb A1c MFr Bld: 5.8 % — ABNORMAL HIGH (ref 4.8–5.6)
Mean Plasma Glucose: 119.76 mg/dL

## 2019-07-23 LAB — HIV ANTIBODY (ROUTINE TESTING W REFLEX): HIV Screen 4th Generation wRfx: NONREACTIVE

## 2019-07-23 MED ORDER — DOCUSATE SODIUM 100 MG PO CAPS: 100.0000 mg | ORAL_CAPSULE | Freq: Two times a day (BID) | ORAL | 0 refills | Status: DC

## 2019-07-23 MED ORDER — IBUPROFEN 800 MG PO TABS: 800.0000 mg | ORAL_TABLET | Freq: Three times a day (TID) | ORAL | 0 refills | Status: DC

## 2019-07-23 MED ORDER — ACETAMINOPHEN 500 MG PO TABS: 1000.0000 mg | ORAL_TABLET | Freq: Four times a day (QID) | ORAL | 0 refills | Status: DC | PRN

## 2019-07-23 MED ORDER — OXYCODONE HCL 5 MG PO TABS
5.0000 mg | ORAL_TABLET | ORAL | Status: DC | PRN
  Administered 2019-07-23: 10 mg via ORAL
  Filled 2019-07-23: qty 2

## 2019-07-23 MED ORDER — METHOCARBAMOL 750 MG PO TABS
750.0000 mg | ORAL_TABLET | Freq: Three times a day (TID) | ORAL | Status: DC
  Administered 2019-07-23: 750 mg via ORAL
  Filled 2019-07-23: qty 1

## 2019-07-23 MED ORDER — OXYCODONE HCL 10 MG PO TABS: 5.0000 mg | ORAL_TABLET | Freq: Four times a day (QID) | ORAL | 0 refills | Status: DC | PRN

## 2019-07-23 MED ORDER — METHOCARBAMOL 750 MG PO TABS: 750.0000 mg | ORAL_TABLET | Freq: Three times a day (TID) | ORAL | 0 refills | Status: DC | PRN

## 2019-07-23 NOTE — Plan of Care (Signed)
  Problem: Clinical Measurements: Goal: Ability to maintain clinical measurements within normal limits will improve Outcome: Progressing   Problem: Clinical Measurements: Goal: Respiratory complications will improve Outcome: Progressing   Problem: Activity: Goal: Risk for activity intolerance will decrease Outcome: Progressing   Problem: Pain Managment: Goal: General experience of comfort will improve Outcome: Progressing

## 2019-07-23 NOTE — Discharge Instructions (Addendum)
Discuss elevated glucose/A1c with your primary care physician   RIB FRACTURES  HOME INSTRUCTIONS   1. PAIN CONTROL:  1. Pain is best controlled by a usual combination of three different methods TOGETHER:  i. Ice/Heat ii. Over the counter pain medication iii. Prescription pain medication 2. You may experience some swelling and bruising in area of broken ribs. Ice packs or heating pads (30-60 minutes up to 6 times a day) will help. Use ice for the first few days to help decrease swelling and bruising, then switch to heat to help relax tight/sore spots and speed recovery. Some people prefer to use ice alone, heat alone, alternating between ice & heat. Experiment to what works for you. Swelling and bruising can take several weeks to resolve.  3. It is helpful to take an over-the-counter pain medication regularly for the first few weeks. Choose one of the following that works best for you:  i. Naproxen (Aleve, etc) Two 220mg  tabs twice a day ii. Ibuprofen (Advil, etc) Three 200mg  tabs four times a day (every meal & bedtime) iii. Acetaminophen (Tylenol, etc) 500-650mg  four times a day (every meal & bedtime) 4. A prescription for pain medication (such as oxycodone, hydrocodone, etc) may be given to you upon discharge. Take your pain medication as prescribed.  i. If you are having problems/concerns with the prescription medicine (does not control pain, nausea, vomiting, rash, itching, etc), please call [phone] to see if we need to switch you to a different pain medicine that will work better for you and/or control your side effect better. ii. If you need a refill on your pain medication, please contact your pharmacy. They will contact our office to request authorization. Prescriptions will not be filled after 5 pm or on week-ends. 1. Avoid getting constipated. When taking pain medications, it is common to experience some constipation. Increasing fluid intake and taking a fiber supplement (such  as Metamucil, Citrucel, FiberCon, MiraLax, etc) 1-2 times a day regularly will usually help prevent this problem from occurring. A mild laxative (prune juice, Milk of Magnesia, MiraLax, etc) should be taken according to package directions if there are no bowel movements after 48 hours.  2. Watch out for diarrhea. If you have many loose bowel movements, simplify your diet to bland foods & liquids for a few days. Stop any stool softeners and decrease your fiber supplement. Switching to mild anti-diarrheal medications (Kayopectate, Pepto Bismol) can help. If this worsens or does not improve, please call us. 3. FOLLOW UP  a. If a follow up appointment is needed one will be scheduled for you. If none is needed with our trauma team, please follow up with your primary care provider within 2-3 weeks from discharge. Please call [facility] at [phone] if you have any questions about follow up.  b. If you have any orthopedic or other injuries you will need to follow up as outlined in your follow up instructions.   WHEN TO CALL us [phone]:  1. Poor pain control 2. Reactions / problems with new medications (rash/itching, nausea, etc)  3. Fever over 101.5 F (38.5 C) 4. Worsening swelling or bruising 5. Worsening pain, productive cough, difficulty breathing or any other concerning symptoms  The clinic staff is available to answer your questions during regular business hours (8:30am-5pm). Please don't hesitate to call and ask to speak to one of our nurses for clinical concerns.  If you have a medical emergency, go to the nearest emergency room or call 911.  A surgeon from [facility] is always on call at the [facility], [place], [place], [place],  [address] ?  MAIN: [phone] ? TOLL FREE: [phone] ?  FAX [phone]  [email or web address]      Information on Rib Fractures  A rib fracture is a break or crack in  one of the bones of the ribs. The ribs are long, curved bones that wrap around your chest and attach to your spine and your breastbone. The ribs protect your heart, lungs, and other organs in the chest. A broken or cracked rib is often painful but is not usually serious. Most rib fractures heal on their own over time. However, rib fractures can be more serious if multiple ribs are broken or if broken ribs move out of place and push against other structures or organs. What are the causes? This condition is caused by:  Repetitive movements with high force, such as pitching a baseball or having severe coughing spells.  A direct blow to the chest, such as a sports injury, a car accident, or a fall.  Cancer that has spread to the bones, which can weaken bones and cause them to break. What are the signs or symptoms? Symptoms of this condition include:  Pain when you breathe in or cough.  Pain when someone presses on the injured area.  Feeling short of breath. How is this diagnosed? This condition is diagnosed with a physical exam and medical history. Imaging tests may also be done, such as:  Chest X-ray.  CT scan.  MRI.  Bone scan.  Chest ultrasound. How is this treated? Treatment for this condition depends on the severity of the fracture. Most rib fractures usually heal on their own in 1-3 months. Sometimes healing takes longer if there is a cough that does not stop or if there are other activities that make the injury worse (aggravating factors). While you heal, you will be given medicines to control the pain. You will also be taught deep breathing exercises. Severe injuries may require hospitalization or surgery. Follow these instructions at home: Managing pain, stiffness, and swelling  If directed, apply ice to the injured area. ? Put ice in a plastic bag. ? Place a towel between your skin and the bag. ? Leave the ice on for 20 minutes, 2-3 times a day.  Take over-the-counter  and prescription medicines only as told by your health care provider. Activity  Avoid a lot of activity and any activities or movements that cause pain. Be careful during activities and avoid bumping the injured rib.  Slowly increase your activity as told by your health care provider. General instructions  Do deep breathing exercises as told by your health care provider. This helps prevent pneumonia, which is a common complication of a broken rib. Your health care provider may instruct you to: ? Take deep breaths several times a day. ? Try to cough several times a day, holding a pillow against the injured area. ? Use a device called incentive spirometer to practice deep breathing several times a day.  Drink enough fluid to keep your urine pale yellow.  Do not wear a rib belt or binder. These restrict breathing, which can lead to pneumonia.  Keep all follow-up visits as told by your health care provider. This is important. Contact a health care provider if:  You have a fever. Get help right away if:  You have difficulty breathing or you are  short of breath.  You develop a cough that does not stop, or you cough up thick or bloody sputum.  You have nausea, vomiting, or pain in your abdomen.  Your pain gets worse and medicine does not help. Summary  A rib fracture is a break or crack in one of the bones of the ribs.  A broken or cracked rib is often painful but is not usually serious.  Most rib fractures heal on their own over time.  Treatment for this condition depends on the severity of the fracture.  Avoid a lot of activity and any activities or movements that cause pain. This information is not intended to replace advice given to you by your health care provider. Make sure you discuss any questions you have with your health care provider. Document Released: 05/02/2005 Document Revised: 08/01/2016 Document Reviewed: 08/01/2016 Elsevier Interactive Patient Education  2019  [employer].

## 2019-07-23 NOTE — Progress Notes (Signed)
Subjective: CC: Rib pain Patient reports pain over his left rib but is worse with deep breathing.  He denies any shortness of breath.  He is on room air.  He is ambulated to and from the bed without difficulty.  Voiding without difficulty.  He denies any headache, neck pain, back pain, abdominal pain or extremity pain.   Objective: Vital signs in last 24 hours: Temp:  [95.8 F (35.4 C)-98.6 F (37 C)] 98.1 F (36.7 C) (03/09 0739) Pulse Rate:  [70-103] 74 (03/09 0739) Resp:  [10-24] 15 (03/09 0334) BP: (117-140)/(73-97) 117/79 (03/09 0739) SpO2:  [98 %-100 %] 100 % (03/09 0739) Weight:  [74.8 kg] 74.8 kg (03/08 2025) Last BM Date: 07/22/19  Intake/Output from previous day: 03/08 0701 - 03/09 0700 In: 480 [P.O.:480] Out: 0  Intake/Output this shift: Total I/O In: -  Out: 600 [Urine:600]  PE: Gen:  Alert, NAD, pleasant HEENT: EOM's intact, pupils equal and round Card:  RRR, no M/G/R heard Pulm:  CTAB, no W/R/R, effort normal. Pulling 1750 on IS. On RA. Chest wall laceration noted, T shaped measuring 3cm in greatest diameter. No active bleeding.  Abd: Soft, NT/ND, +BS Ext:  No tenderness of UE's or LE's. Passive ROM of b/l UE's and LE's without pain or difficulty  Psych: A&Ox3  Skin: no rashes noted, warm and dry  Lab Results:  Recent Labs    07/22/19 2028 07/22/19 2028 07/22/19 2035 07/23/19 0437  WBC 9.5  --   --  11.7*  HGB 13.8   < > 14.6 13.1  HCT 42.1   < > 43.0 38.9*  PLT 264  --   --  235   < > = values in this interval not displayed.   BMET Recent Labs    07/22/19 2028 07/22/19 2028 07/22/19 2035 07/23/19 0437  NA 140   < > 141 139  K 4.1   < > 4.0 3.7  CL 108   < > 107 105  CO2 24  --   --  24  GLUCOSE 172*   < > 159* 83  BUN 13   < > 16 10  CREATININE 0.99   < > 0.80 0.83  CALCIUM 8.9  --   --  9.0   < > = values in this interval not displayed.   PT/INR Recent Labs    07/22/19 2028  LABPROT 12.0  INR 0.9   CMP     Component  Value Date/Time   NA 139 07/23/2019 0437   K 3.7 07/23/2019 0437   CL 105 07/23/2019 0437   CO2 24 07/23/2019 0437   GLUCOSE 83 07/23/2019 0437   BUN 10 07/23/2019 0437   CREATININE 0.83 07/23/2019 0437   CALCIUM 9.0 07/23/2019 0437   PROT 6.8 07/22/2019 2028   ALBUMIN 3.6 07/22/2019 2028   AST 23 07/22/2019 2028   ALT 20 07/22/2019 2028   ALKPHOS 60 07/22/2019 2028   BILITOT 0.3 07/22/2019 2028   GFRNONAA >60 07/23/2019 0437   GFRAA >60 07/23/2019 0437   Lipase  No results found for: LIPASE     Studies/Results: CT HEAD WO CONTRAST  Result Date: 07/22/2019 CLINICAL DATA:  34 year old male level 1 trauma, industrial equipment accident. EXAM: CT HEAD WITHOUT CONTRAST TECHNIQUE: Contiguous axial images were obtained from the base of the skull through the vertex without intravenous contrast. COMPARISON:  Head CT 12/07/2013. FINDINGS: Brain: No midline shift, ventriculomegaly, mass effect, evidence of mass lesion, intracranial  hemorrhage or evidence of cortically based acute infarction. Gray-white matter differentiation is within normal limits throughout the brain. Vascular: No suspicious intracranial vascular hyperdensity. Skull: Chronic left lamina papyracea fracture. No acute osseous abnormality identified. Sinuses/Orbits: Visualized paranasal sinuses and mastoids are stable and well pneumatized. Hyperplastic paranasal sinuses, petrous apex. Other: No acute scalp or orbits soft tissue injury identified. IMPRESSION: 1. No acute traumatic injury identified to the head. 2. Stable and normal noncontrast CT appearance of the brain. Electronically Signed   By: Odessa Fleming M.D.   On: 07/22/2019 21:00   CT CHEST W CONTRAST  Result Date: 07/22/2019 CLINICAL DATA:  34 year old male level 1 trauma, industrial equipment accident. EXAM: CT CHEST, ABDOMEN, AND PELVIS WITH CONTRAST TECHNIQUE: Multidetector CT imaging of the chest, abdomen and pelvis was performed following the standard protocol during bolus  administration of intravenous contrast. CONTRAST:  OMNIPAQUE IOHEXOL 300 MG/ML  SOLN COMPARISON:  Cervical spine CT today reported separately. CT Chest, Abdomen, and Pelvis 03/03/2018 FINDINGS: CT CHEST FINDINGS Cardiovascular: Negative thoracic aorta. Other central mediastinal vascular structures and proximal great vessels appear within normal limits. No cardiomegaly or pericardial effusion. Mediastinum/Nodes: Stable small volume residual. No mediastinal hematoma thymus in the anterior superior mediastinum or lymphadenopathy. Lungs/Pleura: Trace apical paraseptal emphysema. Major airways are patent. Trace retained secretions in the left lateral trachea. No pneumothorax, pleural effusion, or pulmonary contusion. Mild dependent atelectasis in the superior segment of the right lower lobe. Musculoskeletal: Nondisplaced posterior left 1st rib fracture better demonstrated on the cervical spine CT today. No other left rib fracture identified. No right rib fracture identified. Visible shoulder osseous structures appear intact. Intact sternum. Mild thoracic scoliosis. Thoracic vertebrae appear intact. CT ABDOMEN PELVIS FINDINGS Hepatobiliary: Liver and gallbladder appear intact and within normal limits. Pancreas: Negative. Spleen: Negative. Adrenals/Urinary Tract: Normal adrenal glands. Bilateral renal enhancement is symmetric and within normal limits. No delayed renal images are available. Kidneys appear normal. Proximal ureters are unremarkable. The urinary bladder is distended but otherwise unremarkable. Stomach/Bowel: Normal appendix. No dilated large or small bowel. Unremarkable stomach. No free air or free fluid. Vascular/Lymphatic: Major arterial structures in the abdomen and pelvis appear patent and intact. No atherosclerosis. Portal venous system appears patent. No lymphadenopathy. Reproductive: Negative. Other: No pelvic free fluid. Musculoskeletal: Normal thoracic and lumbar segmentation. Lumbar vertebrae  appear intact. Sacrum, SI joints, pelvis and proximal femurs appear intact. No superficial soft tissue injury identified. IMPRESSION: 1. Nondisplaced posterior left 1st rib fracture, better demonstrated on the cervical spine CT today. 2. No other acute traumatic injury identified in the chest, abdomen, or pelvis. 3. Mild superior segment right lower lobe atelectasis. Electronically Signed   By: Odessa Fleming M.D.   On: 07/22/2019 21:20   CT CERVICAL SPINE WO CONTRAST  Result Date: 07/22/2019 CLINICAL DATA:  34 year old male level 1 trauma, industrial equipment accident. EXAM: CT CERVICAL SPINE WITHOUT CONTRAST TECHNIQUE: Multidetector CT imaging of the cervical spine was performed without intravenous contrast. Multiplanar CT image reconstructions were also generated. COMPARISON:  Head CT today. Cervical spine CT 03/03/2018. FINDINGS: Alignment: New leftward flexion/dextroconvex curvature of the cervical spine compared to 2019. Stable straightening of lordosis. Cervicothoracic junction alignment is within normal limits. Bilateral posterior element alignment is within normal limits. Skull base and vertebrae: Visualized skull base is intact. No atlanto-occipital dissociation. No acute osseous abnormality identified. Soft tissues and spinal canal: No prevertebral fluid or swelling. No visible canal hematoma. Elongated left stylohyoid ligament calcification. Negative noncontrast neck soft tissues. Disc levels: Chronic disc and  endplate degeneration appears stable since 2019 with up to mild degenerative cervical spinal stenosis. Upper chest: Nondisplaced posterior left 1st rib fracture. Negative visible lung apices. Visible upper thoracic vertebrae appear grossly intact. IMPRESSION: 1. Nondisplaced posterior left 1st rib fracture. 2. No acute traumatic injury identified in the cervical spine. 3. Chronic cervical spine degeneration appears stable since 2019 with up to mild degenerative spinal stenosis. Electronically Signed    By: Genevie Ann M.D.   On: 07/22/2019 21:06   CT ABDOMEN PELVIS W CONTRAST  Result Date: 07/22/2019 CLINICAL DATA:  34 year old male level 1 trauma, industrial equipment accident. EXAM: CT CHEST, ABDOMEN, AND PELVIS WITH CONTRAST TECHNIQUE: Multidetector CT imaging of the chest, abdomen and pelvis was performed following the standard protocol during bolus administration of intravenous contrast. CONTRAST:  170mL OMNIPAQUE IOHEXOL 300 MG/ML  SOLN COMPARISON:  Cervical spine CT today reported separately. CT Chest, Abdomen, and Pelvis 03/03/2018 FINDINGS: CT CHEST FINDINGS Cardiovascular: Negative thoracic aorta. Other central mediastinal vascular structures and proximal great vessels appear within normal limits. No cardiomegaly or pericardial effusion. Mediastinum/Nodes: Stable small volume residual. No mediastinal hematoma thymus in the anterior superior mediastinum or lymphadenopathy. Lungs/Pleura: Trace apical paraseptal emphysema. Major airways are patent. Trace retained secretions in the left lateral trachea. No pneumothorax, pleural effusion, or pulmonary contusion. Mild dependent atelectasis in the superior segment of the right lower lobe. Musculoskeletal: Nondisplaced posterior left 1st rib fracture better demonstrated on the cervical spine CT today. No other left rib fracture identified. No right rib fracture identified. Visible shoulder osseous structures appear intact. Intact sternum. Mild thoracic scoliosis. Thoracic vertebrae appear intact. CT ABDOMEN PELVIS FINDINGS Hepatobiliary: Liver and gallbladder appear intact and within normal limits. Pancreas: Negative. Spleen: Negative. Adrenals/Urinary Tract: Normal adrenal glands. Bilateral renal enhancement is symmetric and within normal limits. No delayed renal images are available. Kidneys appear normal. Proximal ureters are unremarkable. The urinary bladder is distended but otherwise unremarkable. Stomach/Bowel: Normal appendix. No dilated large or small  bowel. Unremarkable stomach. No free air or free fluid. Vascular/Lymphatic: Major arterial structures in the abdomen and pelvis appear patent and intact. No atherosclerosis. Portal venous system appears patent. No lymphadenopathy. Reproductive: Negative. Other: No pelvic free fluid. Musculoskeletal: Normal thoracic and lumbar segmentation. Lumbar vertebrae appear intact. Sacrum, SI joints, pelvis and proximal femurs appear intact. No superficial soft tissue injury identified. IMPRESSION: 1. Nondisplaced posterior left 1st rib fracture, better demonstrated on the cervical spine CT today. 2. No other acute traumatic injury identified in the chest, abdomen, or pelvis. 3. Mild superior segment right lower lobe atelectasis. Electronically Signed   By: Genevie Ann M.D.   On: 07/22/2019 21:20   DG Chest Portable 1 View  Result Date: 07/22/2019 CLINICAL DATA:  34 year old male level 2 trauma, industrial equipment accident. EXAM: PORTABLE CHEST 1 VIEW COMPARISON:  CT Chest, Abdomen, and Pelvis 03/03/2018 and earlier. FINDINGS: Portable AP upright view at 2017 hours. Larger lung volumes. Mediastinal contours are within normal limits. Visualized tracheal air column is within normal limits. Allowing for portable technique the lungs are clear. No pneumothorax or pleural effusion. No acute osseous abnormality identified. IMPRESSION: No acute cardiopulmonary abnormality or acute traumatic injury identified. Electronically Signed   By: Genevie Ann M.D.   On: 07/22/2019 20:33    Anti-infectives: Anti-infectives (From admission, onward)   None       Assessment/Plan 34yo male s/p torso injury from compression by industrial roller Left posterior 1st rib fx: multimodal pain control, pulmonary toilet. CXR this am without PTX Hyperglycemia-  A1c 5.8. Diet education. Follow up PCP Chest wall wound - >12hours from time of injury. Local wound care.  He reports tetanus is up-to-date within the last year.  FEN - CM VTE - SCDs,  Lovenox  ID - None  Plan: Adjust oral pain medication this morning.  Possible discharge later this morning.  Lives at home with his mother.   LOS: 0 days    Jacinto Halim , [facility] 07/23/2019, 8:51 AM Please see Amion for pager number during day hours 7:00am-4:30pm

## 2019-07-23 NOTE — Progress Notes (Signed)
Removed two PIV from bilateral ACs. Discharge instructions given to patient. Pt verbally confirmed understanding of instructions. Pt taken to car via wc. Mayford Knife RN

## 2019-07-23 NOTE — Progress Notes (Signed)
Pt admitted to room 4n11 from ED via stretcher by ED Tech. Pt A&OX3, breathing equal and unlabored; in NAD. VS taken and in Epic, POC reviewed with pt. Pain is tolerable at present. Chest wound cleansed and dressed with foam. MAE well.

## 2019-07-23 NOTE — Discharge Summary (Signed)
[facility] Discharge Summary   Patient ID: Micheal Schmidt MRN: [id number] DOB/AGE: 10-24-1985 34 y.o.  Admit date: 07/22/2019 Discharge date: 07/23/2019  Admitting Diagnosis: Torso injury from compression by industrial roller Left posterior 1st rib fracture Hyperglycemia  Discharge Diagnosis Patient Active Problem List   Diagnosis Date Noted  . Rib fractures 07/22/2019    Consultants None  Imaging: CT HEAD WO CONTRAST  Result Date: 07/22/2019 CLINICAL DATA:  34 year old male level 1 trauma, industrial equipment accident. EXAM: CT HEAD WITHOUT CONTRAST TECHNIQUE: Contiguous axial images were obtained from the base of the skull through the vertex without intravenous contrast. COMPARISON:  Head CT 12/07/2013. FINDINGS: Brain: No midline shift, ventriculomegaly, mass effect, evidence of mass lesion, intracranial hemorrhage or evidence of cortically based acute infarction. Gray-white matter differentiation is within normal limits throughout the brain. Vascular: No suspicious intracranial vascular hyperdensity. Skull: Chronic left lamina papyracea fracture. No acute osseous abnormality identified. Sinuses/Orbits: Visualized paranasal sinuses and mastoids are stable and well pneumatized. Hyperplastic paranasal sinuses, petrous apex. Other: No acute scalp or orbits soft tissue injury identified. IMPRESSION: 1. No acute traumatic injury identified to the head. 2. Stable and normal noncontrast CT appearance of the brain. Electronically Signed   By: Genevie Ann M.D.   On: 07/22/2019 21:00   CT CHEST W CONTRAST  Result Date: 07/22/2019 CLINICAL DATA:  34 year old male level 1 trauma, industrial equipment accident. EXAM: CT CHEST, ABDOMEN, AND PELVIS WITH CONTRAST TECHNIQUE: Multidetector CT imaging of the chest, abdomen and pelvis was performed following the standard protocol during bolus administration of intravenous contrast. CONTRAST:  130mL OMNIPAQUE IOHEXOL 300 MG/ML  SOLN COMPARISON:   Cervical spine CT today reported separately. CT Chest, Abdomen, and Pelvis 03/03/2018 FINDINGS: CT CHEST FINDINGS Cardiovascular: Negative thoracic aorta. Other central mediastinal vascular structures and proximal great vessels appear within normal limits. No cardiomegaly or pericardial effusion. Mediastinum/Nodes: Stable small volume residual. No mediastinal hematoma thymus in the anterior superior mediastinum or lymphadenopathy. Lungs/Pleura: Trace apical paraseptal emphysema. Major airways are patent. Trace retained secretions in the left lateral trachea. No pneumothorax, pleural effusion, or pulmonary contusion. Mild dependent atelectasis in the superior segment of the right lower lobe. Musculoskeletal: Nondisplaced posterior left 1st rib fracture better demonstrated on the cervical spine CT today. No other left rib fracture identified. No right rib fracture identified. Visible shoulder osseous structures appear intact. Intact sternum. Mild thoracic scoliosis. Thoracic vertebrae appear intact. CT ABDOMEN PELVIS FINDINGS Hepatobiliary: Liver and gallbladder appear intact and within normal limits. Pancreas: Negative. Spleen: Negative. Adrenals/Urinary Tract: Normal adrenal glands. Bilateral renal enhancement is symmetric and within normal limits. No delayed renal images are available. Kidneys appear normal. Proximal ureters are unremarkable. The urinary bladder is distended but otherwise unremarkable. Stomach/Bowel: Normal appendix. No dilated large or small bowel. Unremarkable stomach. No free air or free fluid. Vascular/Lymphatic: Major arterial structures in the abdomen and pelvis appear patent and intact. No atherosclerosis. Portal venous system appears patent. No lymphadenopathy. Reproductive: Negative. Other: No pelvic free fluid. Musculoskeletal: Normal thoracic and lumbar segmentation. Lumbar vertebrae appear intact. Sacrum, SI joints, pelvis and proximal femurs appear intact. No superficial soft tissue  injury identified. IMPRESSION: 1. Nondisplaced posterior left 1st rib fracture, better demonstrated on the cervical spine CT today. 2. No other acute traumatic injury identified in the chest, abdomen, or pelvis. 3. Mild superior segment right lower lobe atelectasis. Electronically Signed   By: Genevie Ann M.D.   On: 07/22/2019 21:20   CT CERVICAL SPINE WO CONTRAST  Result Date: 07/22/2019  CLINICAL DATA:  34 year old male level 1 trauma, industrial equipment accident. EXAM: CT CERVICAL SPINE WITHOUT CONTRAST TECHNIQUE: Multidetector CT imaging of the cervical spine was performed without intravenous contrast. Multiplanar CT image reconstructions were also generated. COMPARISON:  Head CT today. Cervical spine CT 03/03/2018. FINDINGS: Alignment: New leftward flexion/dextroconvex curvature of the cervical spine compared to 2019. Stable straightening of lordosis. Cervicothoracic junction alignment is within normal limits. Bilateral posterior element alignment is within normal limits. Skull base and vertebrae: Visualized skull base is intact. No atlanto-occipital dissociation. No acute osseous abnormality identified. Soft tissues and spinal canal: No prevertebral fluid or swelling. No visible canal hematoma. Elongated left stylohyoid ligament calcification. Negative noncontrast neck soft tissues. Disc levels: Chronic disc and endplate degeneration appears stable since 2019 with up to mild degenerative cervical spinal stenosis. Upper chest: Nondisplaced posterior left 1st rib fracture. Negative visible lung apices. Visible upper thoracic vertebrae appear grossly intact. IMPRESSION: 1. Nondisplaced posterior left 1st rib fracture. 2. No acute traumatic injury identified in the cervical spine. 3. Chronic cervical spine degeneration appears stable since 2019 with up to mild degenerative spinal stenosis. Electronically Signed   By: Odessa Fleming M.D.   On: 07/22/2019 21:06   CT ABDOMEN PELVIS W CONTRAST  Result Date:  07/22/2019 CLINICAL DATA:  34 year old male level 1 trauma, industrial equipment accident. EXAM: CT CHEST, ABDOMEN, AND PELVIS WITH CONTRAST TECHNIQUE: Multidetector CT imaging of the chest, abdomen and pelvis was performed following the standard protocol during bolus administration of intravenous contrast. CONTRAST:  OMNIPAQUE IOHEXOL 300 MG/ML  SOLN COMPARISON:  Cervical spine CT today reported separately. CT Chest, Abdomen, and Pelvis 03/03/2018 FINDINGS: CT CHEST FINDINGS Cardiovascular: Negative thoracic aorta. Other central mediastinal vascular structures and proximal great vessels appear within normal limits. No cardiomegaly or pericardial effusion. Mediastinum/Nodes: Stable small volume residual. No mediastinal hematoma thymus in the anterior superior mediastinum or lymphadenopathy. Lungs/Pleura: Trace apical paraseptal emphysema. Major airways are patent. Trace retained secretions in the left lateral trachea. No pneumothorax, pleural effusion, or pulmonary contusion. Mild dependent atelectasis in the superior segment of the right lower lobe. Musculoskeletal: Nondisplaced posterior left 1st rib fracture better demonstrated on the cervical spine CT today. No other left rib fracture identified. No right rib fracture identified. Visible shoulder osseous structures appear intact. Intact sternum. Mild thoracic scoliosis. Thoracic vertebrae appear intact. CT ABDOMEN PELVIS FINDINGS Hepatobiliary: Liver and gallbladder appear intact and within normal limits. Pancreas: Negative. Spleen: Negative. Adrenals/Urinary Tract: Normal adrenal glands. Bilateral renal enhancement is symmetric and within normal limits. No delayed renal images are available. Kidneys appear normal. Proximal ureters are unremarkable. The urinary bladder is distended but otherwise unremarkable. Stomach/Bowel: Normal appendix. No dilated large or small bowel. Unremarkable stomach. No free air or free fluid. Vascular/Lymphatic: Major arterial  structures in the abdomen and pelvis appear patent and intact. No atherosclerosis. Portal venous system appears patent. No lymphadenopathy. Reproductive: Negative. Other: No pelvic free fluid. Musculoskeletal: Normal thoracic and lumbar segmentation. Lumbar vertebrae appear intact. Sacrum, SI joints, pelvis and proximal femurs appear intact. No superficial soft tissue injury identified. IMPRESSION: 1. Nondisplaced posterior left 1st rib fracture, better demonstrated on the cervical spine CT today. 2. No other acute traumatic injury identified in the chest, abdomen, or pelvis. 3. Mild superior segment right lower lobe atelectasis. Electronically Signed   By: Odessa Fleming M.D.   On: 07/22/2019 21:20   DG Chest Port 1 View  Result Date: 07/23/2019 CLINICAL DATA:  Left rib fractures. EXAM: PORTABLE CHEST 1 VIEW COMPARISON:  07/22/2019 FINDINGS: 0553 hours. No evidence for pneumothorax. No pleural effusion. The cardiopericardial silhouette is within normal limits for size. The visualized bony structures of the thorax are intact. Telemetry leads overlie the chest. IMPRESSION: 1. Posterior left first rib fracture seen on recent CT scan not evident by x-ray. 2. No evidence for pneumothorax or pleural effusion. Electronically Signed   By: Kennith Center M.D.   On: 07/23/2019 09:18   DG Chest Portable 1 View  Result Date: 07/22/2019 CLINICAL DATA:  34 year old male level 2 trauma, industrial equipment accident. EXAM: PORTABLE CHEST 1 VIEW COMPARISON:  CT Chest, Abdomen, and Pelvis 03/03/2018 and earlier. FINDINGS: Portable AP upright view at 2017 hours. Larger lung volumes. Mediastinal contours are within normal limits. Visualized tracheal air column is within normal limits. Allowing for portable technique the lungs are clear. No pneumothorax or pleural effusion. No acute osseous abnormality identified. IMPRESSION: No acute cardiopulmonary abnormality or acute traumatic injury identified. Electronically Signed   By: Odessa Fleming  M.D.   On: 07/22/2019 20:33    Procedures None  Hospital Course:  Micheal Schmidt is a 34yo male who presented to [facility] 3/8 as a level 1 trauma alert after sustaining an injury at work, where he was standing in such a way that his torso was compressed by an industrial roller. He reports that he fell after this and was told that he lost consciousness. He reports pain in the left chest and back. Radiates to neck. Associated with subjectively short of breath. Relieved by sitting upright. Denies abdominal pain.  Workup showed Left posterior 1st rib fracture.  Patient was admitted to the trauma service for pain control and pulmonary toilet. Pain control initially somewhat difficulty but improved with multimodal therapies.  Patient incidentally noted to be hyperglycemic. A1c was checked and found to be slightly elevated at 5.8. Patient was referred to primary care physician and was advised to discuss this at his visit. On 3/9 the patient was tolerating diet, ambulating well, pain well controlled, vital signs stable and felt stable for discharge home.  Patient will follow up as below and knows to call with questions or concerns.    I have personally reviewed the patients medication history on the [place] controlled substance database.    Allergies as of 07/23/2019      Reactions   Bee Pollen Anaphylaxis      Medication List    TAKE these medications   acetaminophen 500 MG tablet Commonly known as: TYLENOL Take 2 tablets (1,000 mg total) by mouth every 6 (six) hours as needed for mild pain.   docusate sodium 100 MG capsule Commonly known as: COLACE Take 1 capsule (100 mg total) by mouth 2 (two) times daily. Take this while on narcotics to avoid getting constipated.   ibuprofen 800 MG tablet Commonly known as: ADVIL Take 1 tablet (800 mg total) by mouth 3 (three) times daily.   methocarbamol 750 MG tablet Commonly known as: ROBAXIN Take 1 tablet (750 mg total) by mouth every 8 (eight) hours as needed  for muscle spasms.   Oxycodone HCl 10 MG Tabs Take 0.5-1 tablets (5-10 mg total) by mouth every 6 (six) hours as needed for moderate pain, severe pain or breakthrough pain.        Follow-up Information    [facility]. Call.   Why: as needed Contact information: [address] [place] [address] [phone]          Signed: Lina Sar  Nathen Balaban, [facility] Surgery 07/23/2019, 10:39 AM Please see Amion for pager number during day hours 7:00am-4:30pm

## 2019-09-25 ENCOUNTER — Other Ambulatory Visit: Payer: Self-pay

## 2019-09-25 ENCOUNTER — Ambulatory Visit (INDEPENDENT_AMBULATORY_CARE_PROVIDER_SITE_OTHER): Payer: Self-pay

## 2019-09-25 ENCOUNTER — Encounter (HOSPITAL_COMMUNITY): Payer: Self-pay | Admitting: Emergency Medicine

## 2019-09-25 ENCOUNTER — Ambulatory Visit (HOSPITAL_COMMUNITY)
Admission: EM | Admit: 2019-09-25 | Discharge: 2019-09-25 | Disposition: A | Discharge status: Home / Self Care | Payer: Self-pay | Attending: Family Medicine | Admitting: Family Medicine

## 2019-09-25 DIAGNOSIS — M940 Chondrocostal junction syndrome [Tietze]: Secondary | ICD-10-CM

## 2019-09-25 DIAGNOSIS — R079 Chest pain, unspecified: Secondary | ICD-10-CM

## 2019-09-25 MED ORDER — DICLOFENAC SODIUM 75 MG PO TBEC: 75.0000 mg | DELAYED_RELEASE_TABLET | Freq: Two times a day (BID) | ORAL | 0 refills | Status: DC
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
## 2019-11-02 ENCOUNTER — Emergency Department (HOSPITAL_COMMUNITY): Payer: BC Managed Care – PPO

## 2019-11-02 ENCOUNTER — Encounter (HOSPITAL_COMMUNITY): Payer: Self-pay

## 2019-11-02 ENCOUNTER — Other Ambulatory Visit: Payer: Self-pay

## 2019-11-02 ENCOUNTER — Emergency Department (HOSPITAL_COMMUNITY)
Admission: EM | Admit: 2019-11-02 | Discharge: 2019-11-03 | Payer: BC Managed Care – PPO | Attending: Emergency Medicine | Admitting: Emergency Medicine

## 2019-11-02 DIAGNOSIS — Y9289 Other specified places as the place of occurrence of the external cause: Secondary | ICD-10-CM | POA: Insufficient documentation

## 2019-11-02 DIAGNOSIS — Y999 Unspecified external cause status: Secondary | ICD-10-CM | POA: Diagnosis not present

## 2019-11-02 DIAGNOSIS — F17210 Nicotine dependence, cigarettes, uncomplicated: Secondary | ICD-10-CM | POA: Insufficient documentation

## 2019-11-02 DIAGNOSIS — R0789 Other chest pain: Secondary | ICD-10-CM | POA: Diagnosis not present

## 2019-11-02 DIAGNOSIS — W1839XA Other fall on same level, initial encounter: Secondary | ICD-10-CM | POA: Diagnosis not present

## 2019-11-02 DIAGNOSIS — Y9302 Activity, running: Secondary | ICD-10-CM | POA: Diagnosis not present

## 2019-11-02 DIAGNOSIS — S0003XA Contusion of scalp, initial encounter: Secondary | ICD-10-CM | POA: Diagnosis not present

## 2019-11-02 DIAGNOSIS — S0990XA Unspecified injury of head, initial encounter: Secondary | ICD-10-CM | POA: Diagnosis present
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
## 2019-11-03 ENCOUNTER — Emergency Department (HOSPITAL_COMMUNITY): Payer: BC Managed Care – PPO

## 2019-11-03 MED ORDER — ACETAMINOPHEN 325 MG PO TABS
650.0000 mg | ORAL_TABLET | Freq: Once | ORAL | Status: AC
  Administered 2019-11-03: 650 mg via ORAL
  Filled 2019-11-03: qty 2

## 2019-11-03 MED ORDER — IBUPROFEN 200 MG PO TABS
600.0000 mg | ORAL_TABLET | Freq: Once | ORAL | Status: AC
  Administered 2019-11-03: 600 mg via ORAL
  Filled 2019-11-03: qty 3
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
## 2019-11-03 NOTE — Discharge Instructions (Addendum)
Your x-rays do not show any rib fractures tonight.  Your CT of your head does not show any acute fracture or bruising of your brain.  You can use ice packs for comfort.  You can take ibuprofen 600 mg plus acetaminophen 650 mg every 6 hours as needed for pain.  Return to the emergency department for any problems listed on the head injury sheet. When I review the [place] database you have not been prescribed any oxycodone since March 2021 when you received #30 tablets.

## 2020-03-04 ENCOUNTER — Ambulatory Visit (HOSPITAL_COMMUNITY)
Admission: EM | Admit: 2020-03-04 | Discharge: 2020-03-04 | Disposition: A | Discharge status: Home / Self Care | Payer: Self-pay | Attending: Family Medicine | Admitting: Family Medicine

## 2020-03-04 ENCOUNTER — Encounter (HOSPITAL_COMMUNITY): Payer: Self-pay | Admitting: Emergency Medicine

## 2020-03-04 ENCOUNTER — Other Ambulatory Visit: Payer: Self-pay

## 2020-03-04 ENCOUNTER — Ambulatory Visit (INDEPENDENT_AMBULATORY_CARE_PROVIDER_SITE_OTHER): Payer: Self-pay

## 2020-03-04 DIAGNOSIS — M79641 Pain in right hand: Secondary | ICD-10-CM

## 2020-03-04 DIAGNOSIS — Y9361 Activity, american tackle football: Secondary | ICD-10-CM

## 2020-03-04 DIAGNOSIS — S62339A Displaced fracture of neck of unspecified metacarpal bone, initial encounter for closed fracture: Secondary | ICD-10-CM

## 2020-03-04 MED ORDER — TRAMADOL HCL 50 MG PO TABS: 50.0000 mg | ORAL_TABLET | Freq: Four times a day (QID) | ORAL | 0 refills | Status: DC | PRN

## 2020-03-04 NOTE — Progress Notes (Signed)
Orthopedic Tech Progress Note Patient Details:  Micheal Schmidt January 15, 1986 [id number] Applied by [facility] Type of Ortho Device: Arm sling, Ulna gutter splint Ortho Device/Splint Location: RUE Ortho Device/Splint Interventions: Ordered, Application   Post Interventions Patient Tolerated: Well Instructions Provided: Care of device   Donald Pore 03/04/2020, 3:28 PM

## 2020-03-04 NOTE — Discharge Instructions (Signed)
Elevate, apply ice to help with swelling.  May use 800mg  of ibuprofen every 8 hours. Tramadol every 6 hours as needed for breakthrough pain.  Keep splint in place until seen by orthopedics.  Please follow up with orthopedics for definitive treatment.
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
## 2020-03-04 NOTE — ED Notes (Signed)
Ortho tech notified.  

## 2020-03-04 NOTE — ED Notes (Signed)
Ortho tech applied ulnar gutter and sling

## 2020-09-18 ENCOUNTER — Encounter (HOSPITAL_COMMUNITY): Payer: Self-pay | Admitting: Emergency Medicine

## 2020-09-18 ENCOUNTER — Emergency Department (HOSPITAL_COMMUNITY)
Admission: EM | Admit: 2020-09-18 | Discharge: 2020-09-19 | Disposition: A | Discharge status: Home / Self Care | Payer: Self-pay | Attending: Emergency Medicine | Admitting: Emergency Medicine

## 2020-09-18 ENCOUNTER — Other Ambulatory Visit: Payer: Self-pay

## 2020-09-18 DIAGNOSIS — X58XXXA Exposure to other specified factors, initial encounter: Secondary | ICD-10-CM | POA: Insufficient documentation

## 2020-09-18 DIAGNOSIS — T50901A Poisoning by unspecified drugs, medicaments and biological substances, accidental (unintentional), initial encounter: Secondary | ICD-10-CM | POA: Insufficient documentation

## 2020-09-18 DIAGNOSIS — F17210 Nicotine dependence, cigarettes, uncomplicated: Secondary | ICD-10-CM | POA: Insufficient documentation

## 2020-09-18 LAB — CBC WITH DIFFERENTIAL/PLATELET
Abs Immature Granulocytes: 0.03 10*3/uL (ref 0.00–0.07)
Basophils Absolute: 0.1 10*3/uL (ref 0.0–0.1)
Basophils Relative: 1 %
Eosinophils Absolute: 0.1 10*3/uL (ref 0.0–0.5)
Eosinophils Relative: 2 %
HCT: 42.1 % (ref 39.0–52.0)
Hemoglobin: 13.9 g/dL (ref 13.0–17.0)
Immature Granulocytes: 0 %
Lymphocytes Relative: 46 %
Lymphs Abs: 3.3 10*3/uL (ref 0.7–4.0)
MCH: 30.8 pg (ref 26.0–34.0)
MCHC: 33 g/dL (ref 30.0–36.0)
MCV: 93.1 fL (ref 80.0–100.0)
Monocytes Absolute: 0.7 10*3/uL (ref 0.1–1.0)
Monocytes Relative: 10 %
Neutro Abs: 3 10*3/uL (ref 1.7–7.7)
Neutrophils Relative %: 41 %
Platelets: 251 10*3/uL (ref 150–400)
RBC: 4.52 MIL/uL (ref 4.22–5.81)
RDW: 14.5 % (ref 11.5–15.5)
WBC: 7.2 10*3/uL (ref 4.0–10.5)
nRBC: 0 % (ref 0.0–0.2)

## 2020-09-18 LAB — COMPREHENSIVE METABOLIC PANEL
ALT: 20 U/L (ref 0–44)
AST: 25 U/L (ref 15–41)
Albumin: 3.7 g/dL (ref 3.5–5.0)
Alkaline Phosphatase: 59 U/L (ref 38–126)
Anion gap: 10 (ref 5–15)
BUN: 13 mg/dL (ref 6–20)
CO2: 22 mmol/L (ref 22–32)
Calcium: 8.7 mg/dL — ABNORMAL LOW (ref 8.9–10.3)
Chloride: 100 mmol/L (ref 98–111)
Creatinine, Ser: 0.81 mg/dL (ref 0.61–1.24)
GFR, Estimated: >60 mL/min (ref >60)
Glucose, Bld: 125 mg/dL — ABNORMAL HIGH (ref 70–99)
Potassium: 3.2 mmol/L — ABNORMAL LOW (ref 3.5–5.1)
Sodium: 132 mmol/L — ABNORMAL LOW (ref 135–145)
Total Bilirubin: 0.4 mg/dL (ref 0.3–1.2)
Total Protein: 6.8 g/dL (ref 6.5–8.1)

## 2020-09-18 LAB — ACETAMINOPHEN LEVEL: Acetaminophen (Tylenol), Serum: <10 ug/mL — ABNORMAL LOW (ref 10–30)

## 2020-09-18 LAB — SALICYLATE LEVEL: Salicylate Lvl: <7 mg/dL — ABNORMAL LOW (ref 7.0–30.0)

## 2020-09-18 LAB — ETHANOL: Alcohol, Ethyl (B): 90 mg/dL — ABNORMAL HIGH (ref <10)

## 2020-09-18 NOTE — ED Triage Notes (Signed)
Patient stated " I don't feel myself" , reports auditory hallucinations , denies SI or HI .

## 2020-09-18 NOTE — ED Provider Notes (Signed)
Emergency Medicine Provider Triage Evaluation Note  Micheal Schmidt , a 35 y.o. male  was evaluated in triage.  Pt complains of "not feeling like himself" patient has difficulty providing additional history or elaborating on this.  Patient reports that he has been having hallucinations.  He reports he hears the devil telling him things.  He denies any suicidal or homicidal ideations.  Patient repeatedly just states that he does not feel like himself.  He is not sure how long this has been occurring.  Denies any other focal medical complaints.  Review of Systems  Positive: Hallucinations Negative: SI, HI, chest pain, abdominal pain  Physical Exam  BP 121/80 (BP Location: Right Arm)   Pulse (!) 108   Temp 98.6 F (37 C) (Oral)   Resp 12   Ht 5\' 9"  (1.753 m)   Wt 68 kg   SpO2 98%   BMI 22.15 kg/m  Gen:   Awake, no distress  Resp:  Normal effort  MSK:   Moves extremities without difficulty  Other:  Patient very withdrawn, difficulty answering most questions  Medical Decision Making  Medically screening exam initiated at 8:39 PM.  Appropriate orders placed.  Micheal Schmidt was informed that the remainder of the evaluation will be completed by another provider, this initial triage assessment does not replace that evaluation, and the importance of remaining in the ED until their evaluation is complete.  Patient is here voluntarily, denies SI, HI, has not been agitated or aggressive.   Yehuda Savannah 09/18/20 2045    2046, MD 09/18/20 2154

## 2020-09-19 NOTE — ED Notes (Signed)
The dp saw this pt initially  When  I asked him a question he replied that he had already told the doctor whay he was here

## 2020-09-19 NOTE — ED Notes (Signed)
Pt still sleeping...
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
## 2021-06-30 ENCOUNTER — Encounter (HOSPITAL_COMMUNITY): Payer: Self-pay | Admitting: Emergency Medicine

## 2021-06-30 ENCOUNTER — Other Ambulatory Visit: Payer: Self-pay

## 2021-06-30 ENCOUNTER — Emergency Department (HOSPITAL_COMMUNITY)
Admission: EM | Admit: 2021-06-30 | Discharge: 2021-06-30 | Disposition: A | Discharge status: Home / Self Care | Payer: Self-pay | Attending: Student | Admitting: Student

## 2021-06-30 DIAGNOSIS — L02412 Cutaneous abscess of left axilla: Secondary | ICD-10-CM | POA: Insufficient documentation

## 2021-06-30 MED ORDER — LIDOCAINE-EPINEPHRINE (PF) 2 %-1:200000 IJ SOLN
20.0000 mL | Freq: Once | INTRAMUSCULAR | Status: AC
  Administered 2021-06-30: 20 mL
  Filled 2021-06-30: qty 20

## 2021-06-30 MED ORDER — HYDROCODONE-ACETAMINOPHEN 5-325 MG PO TABS
1.0000 | ORAL_TABLET | Freq: Once | ORAL | Status: AC
  Administered 2021-06-30: 1 via ORAL
  Filled 2021-06-30: qty 1

## 2021-06-30 MED ORDER — DOXYCYCLINE HYCLATE 100 MG PO CAPS: 100.0000 mg | ORAL_CAPSULE | Freq: Two times a day (BID) | ORAL | 0 refills | Status: DC

## 2021-06-30 NOTE — Discharge Instructions (Signed)
Please use Tylenol or ibuprofen for pain.  You may use 600 mg ibuprofen every 6 hours or 1000 mg of Tylenol every 6 hours.  You may choose to alternate between the 2.  This would be most effective.  Not to exceed 4 g of Tylenol within 24 hours.  Not to exceed 3200 mg ibuprofen 24 hours.  Please take the entire course of antibiotics I am prescribing, I recommend that you take them on a full stomach as it can cause some GI upset.  Additionally recommend that you wear sunscreen while you are taking them as they can cause some sun sensitivity.  As discussed you can remove the packing in your left armpit in around 3 days.  If you notice that this abscess is again filling with fluid I recommend consulting a dermatologist or general surgeon to discuss possible excision of the entire abscess.

## 2021-06-30 NOTE — ED Provider Notes (Signed)
[facility] EMERGENCY DEPARTMENT Provider Note   CSN: [id number] Arrival date & time: 06/30/21  0542     History  Chief Complaint  Patient presents with   Abscess    Micheal Schmidt is a 36 y.o. male Micheal Schmidt is a 36 y.o. male with past medical history significant for abscess left axilla presents with concern for same.  Patient reports that this abscess is, but over the last 2 days, he has tried warm compresses, without relief.  Reports that sometimes the come up and decrease in size on their own without any intervention.  Reports that he has had to have it drained in the past.  He denies any systemic fever, chills, nausea, vomiting.  Patient reports that he does not get any abscesses on the other armpit, no history of hidradenitis suppurativa.   Abscess     Home Medications Prior to Admission medications   Medication Sig Start Date End Date Taking? Authorizing Provider  doxycycline (VIBRAMYCIN) 100 MG capsule Take 1 capsule (100 mg total) by mouth 2 (two) times daily. 06/30/21  Yes Harmony Sandell H, PA-C  acetaminophen (TYLENOL) 500 MG tablet Take 2 tablets (1,000 mg total) by mouth every 6 (six) hours as needed for mild pain. 07/23/19   Meuth, Lina Sar, PA-C  diclofenac (VOLTAREN) 75 MG EC tablet Take 1 tablet (75 mg total) by mouth 2 (two) times daily. 09/25/19   Mardella Layman, MD  docusate sodium (COLACE) 100 MG capsule Take 1 capsule (100 mg total) by mouth 2 (two) times daily. Take this while on narcotics to avoid getting constipated. 07/23/19   Meuth, Brooke A, PA-C  ibuprofen (ADVIL) 800 MG tablet Take 1 tablet (800 mg total) by mouth 3 (three) times daily. 07/23/19   Meuth, Brooke A, PA-C  methocarbamol (ROBAXIN) 500 MG tablet Take 2 tablets (1,000 mg total) by mouth 4 (four) times daily as needed for muscle spasms (muscle spasm/pain). 03/03/18   Samuel Jester, DO  methocarbamol (ROBAXIN) 750 MG tablet Take 1 tablet (750 mg total) by mouth every 8 (eight)  hours as needed for muscle spasms. 07/23/19   Meuth, Brooke A, PA-C  oxyCODONE 10 MG TABS Take 0.5-1 tablets (5-10 mg total) by mouth every 6 (six) hours as needed for moderate pain, severe pain or breakthrough pain. 07/23/19   Meuth, Lina Sar, PA-C  oxyCODONE-acetaminophen (PERCOCET/ROXICET) 5-325 MG tablet 1 or 2 tabs PO q8h prn pain 03/03/18   Samuel Jester, DO  traMADol (ULTRAM) 50 MG tablet Take 1 tablet (50 mg total) by mouth every 6 (six) hours as needed. 03/04/20   Georgetta Haber, NP      Allergies    Bee pollen and Bee venom    Review of Systems   Review of Systems  Skin:  Positive for wound.  All other systems reviewed and are negative.  Physical Exam Updated Vital Signs BP (!) 134/91 (BP Location: Left Arm)    Pulse 77    Temp 98.2 F (36.8 C) (Oral)    Resp 16    SpO2 100%  Physical Exam Vitals and nursing note reviewed.  Constitutional:      General: He is not in acute distress.    Appearance: Normal appearance.  HENT:     Head: Normocephalic and atraumatic.  Eyes:     General:        Right eye: No discharge.        Left eye: No discharge.  Cardiovascular:     Rate  and Rhythm: Normal rate and regular rhythm.  Pulmonary:     Effort: Pulmonary effort is normal. No respiratory distress.  Musculoskeletal:        General: No deformity.     Comments: Approximately 3 cm x 4 cm abscess noted in the left axilla with some surrounding cellulitis and fluctuance  Skin:    General: Skin is warm and dry.  Neurological:     Mental Status: He is alert and oriented to person, place, and time.  Psychiatric:        Mood and Affect: Mood normal.        Behavior: Behavior normal.    ED Results / Procedures / Treatments   Labs (all labs ordered are listed, but only abnormal results are displayed) Labs Reviewed - No data to display  EKG None  Radiology No results found.  Procedures .Marland KitchenIncision and Drainage  Date/Time: 06/30/2021 9:22 AM Performed by: Olene Floss, PA-C Authorized by: Olene Floss, PA-C   Consent:    Consent obtained:  Verbal   Consent given by:  Patient   Risks discussed:  Bleeding, incomplete drainage, pain and damage to other organs   Alternatives discussed:  No treatment Universal protocol:    Procedure explained and questions answered to patient or proxy's satisfaction: yes     Patient identity confirmed:  Verbally with patient Location:    Type:  Abscess   Size:  3x4cm   Location:  Upper extremity   Upper extremity location:  Arm (left axilla)   Arm location:  L upper arm Pre-procedure details:    Skin preparation:  Betadine Anesthesia:    Anesthesia method:  Local infiltration   Local anesthetic:  Lidocaine 2% WITH epi Procedure type:    Complexity:  Complex Procedure details:    Incision types:  Single straight   Incision depth:  Subcutaneous   Wound management:  Probed and deloculated, extensive cleaning and debrided   Drainage:  Purulent and bloody   Drainage amount:  Moderate   Packing materials:  1/4 in gauze Post-procedure details:    Procedure completion:  Tolerated well, no immediate complications    Medications Ordered in ED Medications  lidocaine-EPINEPHrine (XYLOCAINE W/EPI) 2 %-1:200000 (PF) injection 20 mL (20 mLs Infiltration Given 06/30/21 0836)  HYDROcodone-acetaminophen (NORCO/VICODIN) 5-325 MG per tablet 1 tablet (1 tablet Oral Given 06/30/21 4008)    ED Course/ Medical Decision Making/ A&P                           Medical Decision Making Risk Prescription drug management.   Is a 36 year old male with past medical history significant for previous staph infection, previous abscess in the same location on the left axilla who presents with painful swollen abscess for the last 2 days.  Marriage differential diagnosis is infected abscess, with progression to bacteremia or septicemia.  On my physical exam I see a localized infection with some surrounding cellulitis  changes, and fluctuance.  Patient has no signs or symptoms of bacteremia or septicemia, is afebrile, denies any fever or chills.  His vital signs have remained stable throughout the duration of his visit.  I drained the abscess and packed it as described in the procedure note above.  Patient tolerated the procedure.  Will discharge with doxycycline to cover for MRSA infection.  Discussed with patient that he can remove the packing in around 3 days.  Encouraged ibuprofen and Tylenol for pain control.  Patient  discharged in stable condition at this time, return precautions given. Final Clinical Impression(s) / ED Diagnoses Final diagnoses:  Abscess of left axilla    Rx / DC Orders ED Discharge Orders          Ordered    doxycycline (VIBRAMYCIN) 100 MG capsule  2 times daily        06/30/21 0856              Cordarrel Stiefel, Harrel Carina, PA-C 06/30/21 3570    Glendora Score, MD 06/30/21 1606

## 2021-06-30 NOTE — ED Triage Notes (Signed)
Pt reported to ED with c/o abscess to left axilla x2 days. Has hx of the same in affected area. States he has tried at home remedies and hot compresses with no relief in symptoms.

## 2021-10-03 ENCOUNTER — Emergency Department (HOSPITAL_COMMUNITY): Payer: Self-pay

## 2021-10-03 ENCOUNTER — Encounter (HOSPITAL_COMMUNITY): Payer: Self-pay | Admitting: Emergency Medicine

## 2021-10-03 ENCOUNTER — Emergency Department (HOSPITAL_COMMUNITY)
Admission: EM | Admit: 2021-10-03 | Discharge: 2021-10-03 | Disposition: A | Discharge status: Home / Self Care | Payer: Self-pay | Attending: Emergency Medicine | Admitting: Emergency Medicine

## 2021-10-03 ENCOUNTER — Other Ambulatory Visit: Payer: Self-pay

## 2021-10-03 DIAGNOSIS — S6991XA Unspecified injury of right wrist, hand and finger(s), initial encounter: Secondary | ICD-10-CM

## 2021-10-03 DIAGNOSIS — M79644 Pain in right finger(s): Secondary | ICD-10-CM | POA: Insufficient documentation

## 2021-10-03 DIAGNOSIS — Y9359 Activity, other involving other sports and athletics played individually: Secondary | ICD-10-CM | POA: Insufficient documentation

## 2021-10-03 DIAGNOSIS — W232XXA Caught, crushed, jammed or pinched between a moving and stationary object, initial encounter: Secondary | ICD-10-CM | POA: Insufficient documentation

## 2021-10-03 MED ORDER — NAPROXEN 500 MG PO TABS: 500.0000 mg | ORAL_TABLET | Freq: Two times a day (BID) | ORAL | 0 refills | Status: DC

## 2021-10-03 NOTE — Progress Notes (Signed)
Orthopedic Tech Progress Note Patient Details:  Micheal Schmidt 09/10/1985 [id number]  Ortho Devices Type of Ortho Device: Thumb velcro splint Ortho Device/Splint Location: rue Ortho Device/Splint Interventions: Ordered, Application, Adjustment   Post Interventions Patient Tolerated: Well Instructions Provided: Care of device, Adjustment of device  Trinna Post 10/03/2021, 11:36 PM

## 2021-10-03 NOTE — Discharge Instructions (Signed)
Follow up with a hand surgeon  Return for new worsening symptoms

## 2021-10-03 NOTE — ED Triage Notes (Signed)
Patient injured his right thumb while playing with his son last week with mild swelling/intermittent pain .
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
## 2022-04-12 IMAGING — DX DG CHEST 2V
2 series · 2 of 2 positions shown · non-contrast
Comparison: July 23, 2019

CLINICAL DATA: Chest pain

EXAM:
CHEST - 2 VIEW

[chest pa]
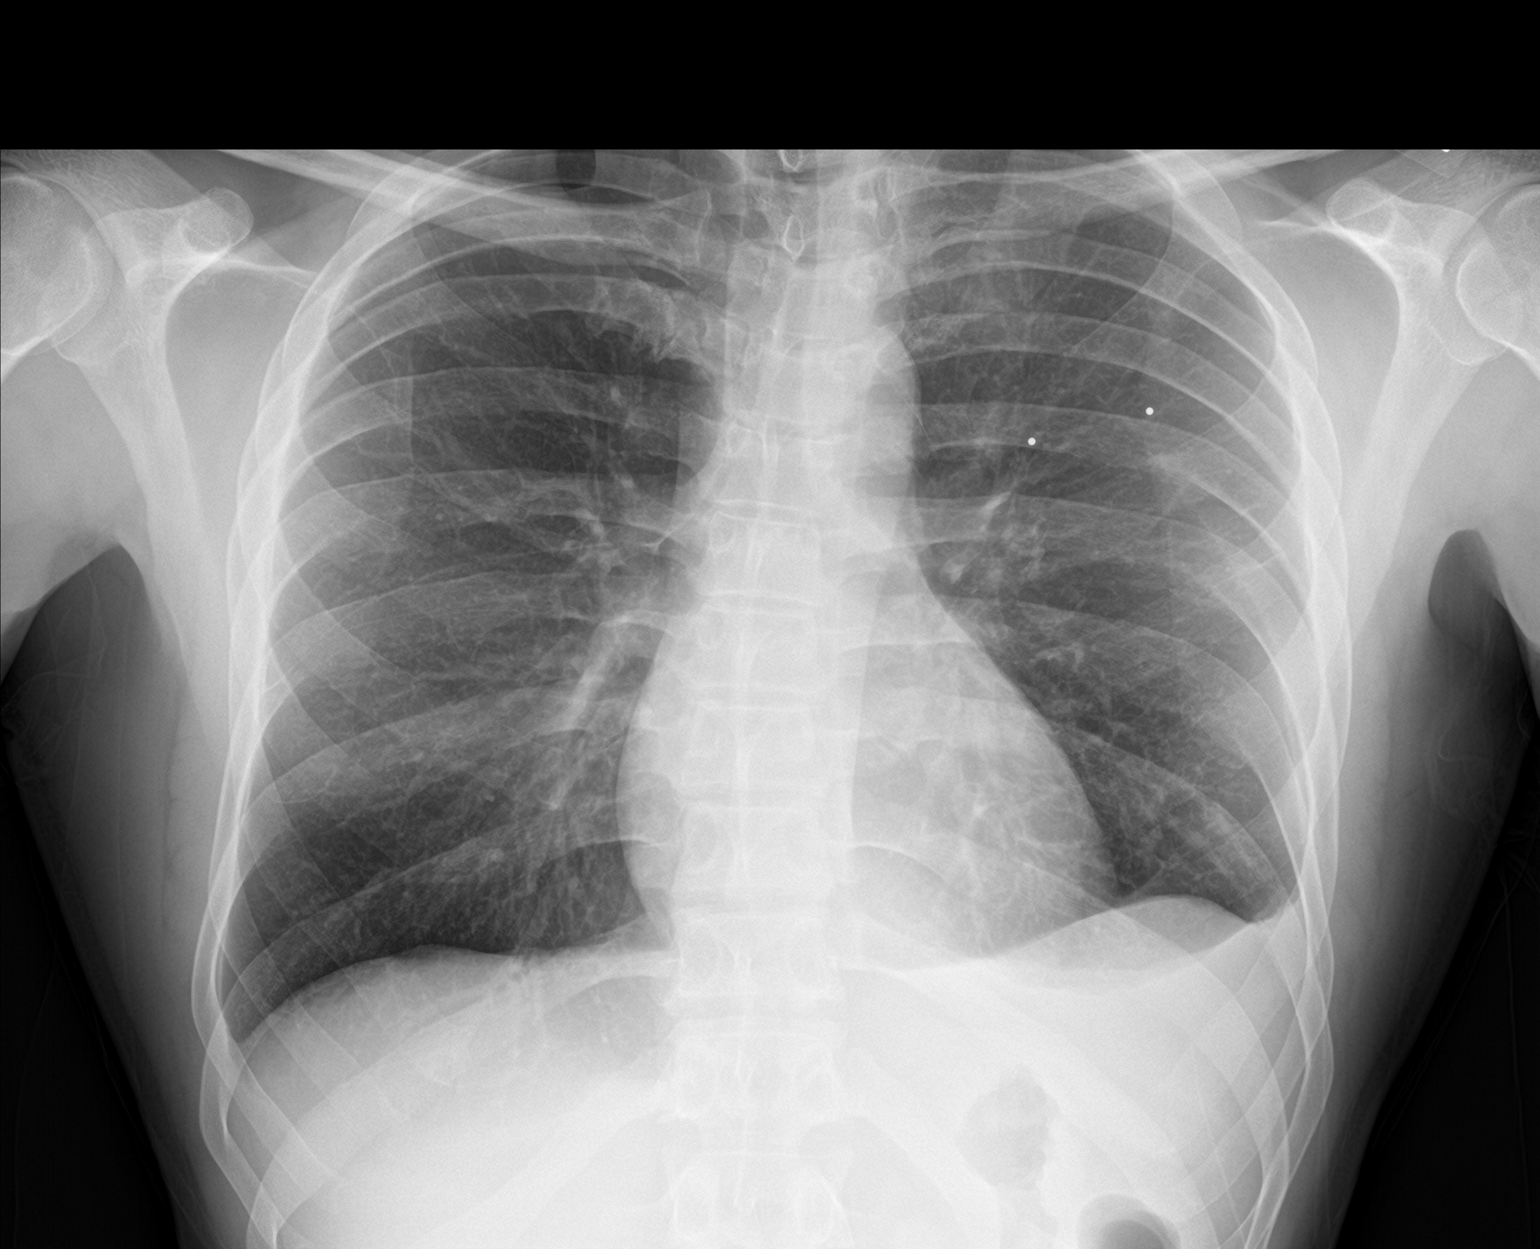

[chest lat]
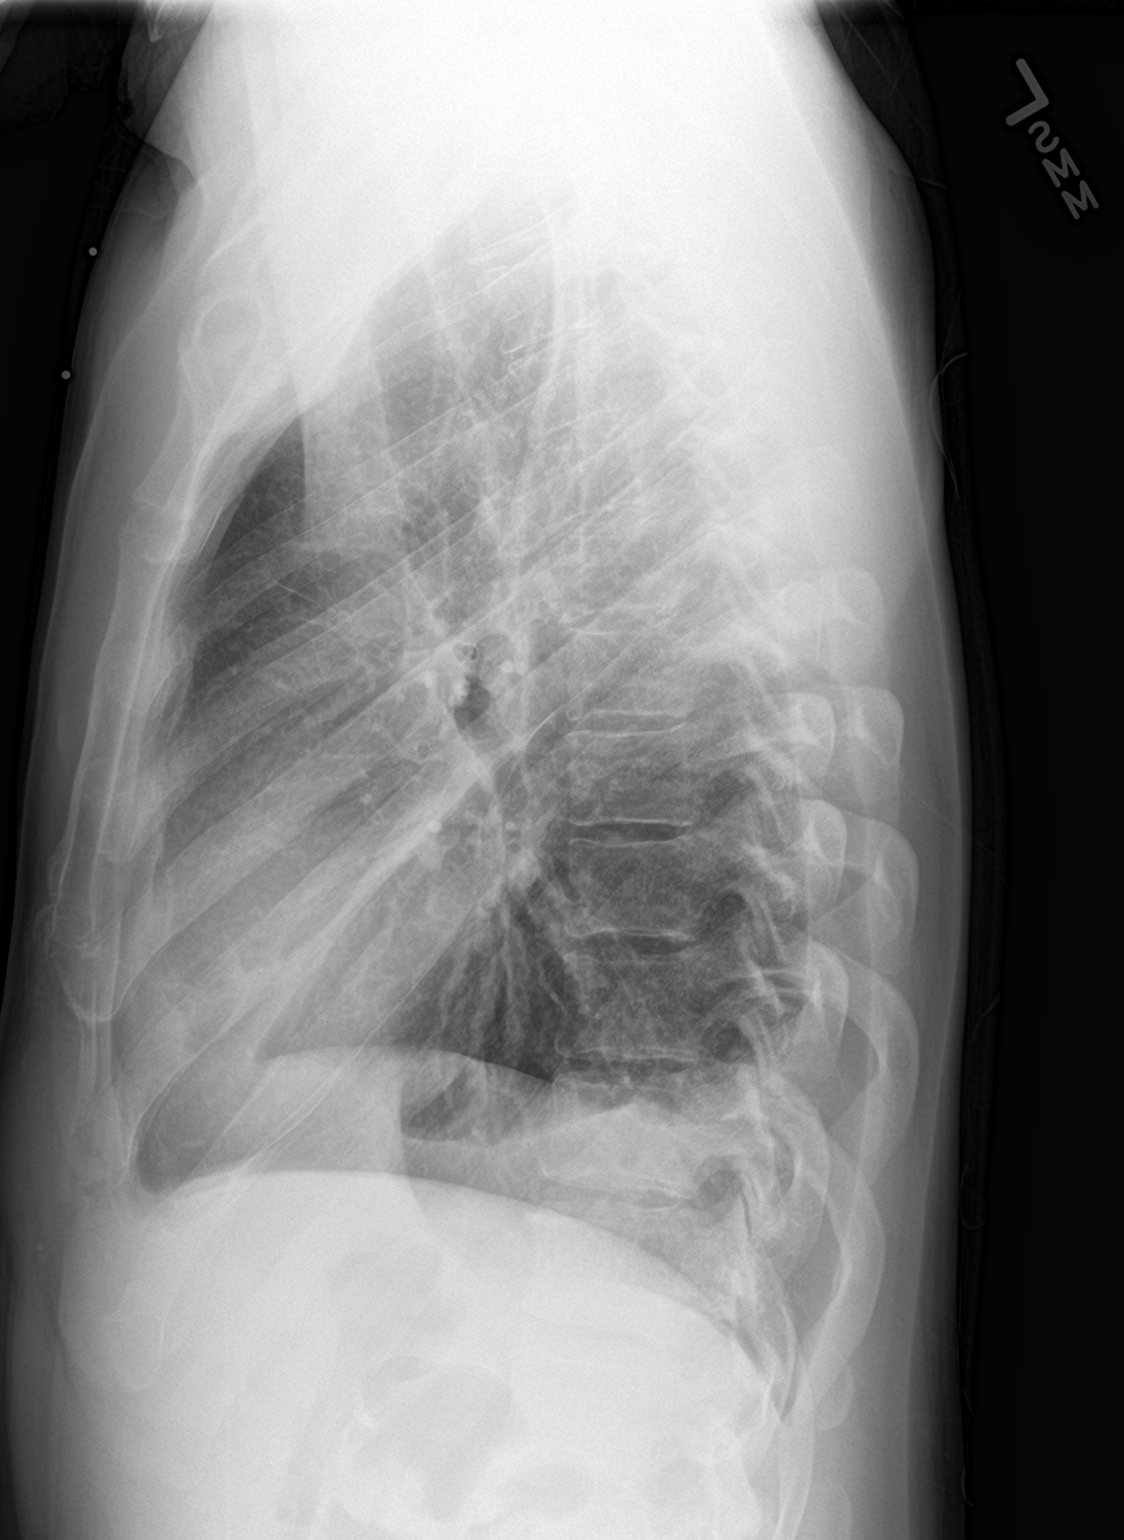

[2 of 2 positions shown; findings below may reference images not displayed]

FINDINGS: There is a fairly small left pleural effusion. Lungs elsewhere are
clear. Heart size and pulmonary vascularity are normal. No
adenopathy. There is no appreciable pneumothorax. No appreciable rib
fracture. There is mild upper thoracic levoscoliosis.
IMPRESSION: Fairly small left pleural effusion. Lungs otherwise clear. No rib
fracture appreciable by radiography. Cardiac silhouette within
normal limits.

## 2022-10-20 ENCOUNTER — Encounter (HOSPITAL_COMMUNITY): Payer: Self-pay

## 2022-10-20 ENCOUNTER — Ambulatory Visit (HOSPITAL_COMMUNITY)
Admission: EM | Admit: 2022-10-20 | Discharge: 2022-10-20 | Disposition: A | Discharge status: Home / Self Care | Payer: BLUE CROSS/BLUE SHIELD | Attending: Sports Medicine | Admitting: Sports Medicine

## 2022-10-20 DIAGNOSIS — M7918 Myalgia, other site: Secondary | ICD-10-CM | POA: Diagnosis not present

## 2022-10-20 MED ORDER — CYCLOBENZAPRINE HCL 10 MG PO TABS: 10.0000 mg | ORAL_TABLET | Freq: Every day | ORAL | 0 refills | Status: DC

## 2022-10-20 NOTE — Discharge Instructions (Addendum)
For your muscular pain I recommend you continue with the ibuprofen and ice or heat as needed.  In the evenings you can take a muscle relaxer that I prescribed and sent to your pharmacy to help with some of the muscle spasms.  Follow-up with your primary care provider or here at the urgent care if symptoms worsen or fail to improve
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
## 2023-05-07 ENCOUNTER — Encounter (HOSPITAL_COMMUNITY): Payer: Self-pay | Admitting: Emergency Medicine

## 2023-05-07 ENCOUNTER — Ambulatory Visit (HOSPITAL_COMMUNITY)
Admission: EM | Admit: 2023-05-07 | Discharge: 2023-05-07 | Disposition: A | Discharge status: Home / Self Care | Payer: BLUE CROSS/BLUE SHIELD | Attending: Nurse Practitioner | Admitting: Nurse Practitioner

## 2023-05-07 DIAGNOSIS — J069 Acute upper respiratory infection, unspecified: Secondary | ICD-10-CM

## 2023-05-07 LAB — POC COVID19/FLU A&B COMBO
Covid Antigen, POC: NEGATIVE
Influenza A Antigen, POC: NEGATIVE
Influenza B Antigen, POC: NEGATIVE

## 2023-05-07 MED ORDER — PROMETHAZINE-DM 6.25-15 MG/5ML PO SYRP: 5.0000 mL | ORAL_SOLUTION | Freq: Every evening | ORAL | 0 refills | Status: DC | PRN

## 2023-05-07 MED ORDER — BENZONATATE 100 MG PO CAPS: 100.0000 mg | ORAL_CAPSULE | Freq: Three times a day (TID) | ORAL | 0 refills | Status: DC | PRN

## 2023-05-07 NOTE — Discharge Instructions (Signed)
You have a viral upper respiratory infection.  Symptoms should improve over the next week to 10 days.  If you develop chest pain or shortness of breath, go to the emergency room.  COVID-19 and influenza test are negative today.  Some things that can make you feel better are: - Increased rest - Increasing fluid with water/sugar free electrolytes - Acetaminophen and ibuprofen as needed for fever/pain - Salt water gargling, chloraseptic spray and throat lozenges - OTC guaifenesin (Mucinex) 600 mg twice daily for congestion - Saline sinus flushes or a neti pot - Humidifying the air -Tessalon Perles every 8 hours as needed for dry cough and cough syrup at night time as needed for dry cough

## 2023-05-07 NOTE — ED Triage Notes (Signed)
Over week ago cough and congestion started, thought was cold so was taking OTC medications. Diarrhea and body aches started 3 days ago.
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
## 2023-07-10 ENCOUNTER — Encounter (HOSPITAL_COMMUNITY): Payer: Self-pay | Admitting: Emergency Medicine

## 2023-07-10 ENCOUNTER — Ambulatory Visit (HOSPITAL_COMMUNITY)
Admission: EM | Admit: 2023-07-10 | Discharge: 2023-07-10 | Disposition: A | Discharge status: Home / Self Care | Payer: BLUE CROSS/BLUE SHIELD | Attending: Family Medicine | Admitting: Family Medicine

## 2023-07-10 DIAGNOSIS — M25561 Pain in right knee: Secondary | ICD-10-CM | POA: Diagnosis not present

## 2023-07-10 DIAGNOSIS — M545 Low back pain, unspecified: Secondary | ICD-10-CM | POA: Diagnosis not present

## 2023-07-10 MED ORDER — TIZANIDINE HCL 4 MG PO TABS: 4.0000 mg | ORAL_TABLET | Freq: Three times a day (TID) | ORAL | 0 refills | Status: DC | PRN
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
## 2023-07-10 NOTE — Discharge Instructions (Signed)
  Take tizanidine 4 mg--1 every 8 hours as needed for muscle spasms; this medication can cause dizziness and sleepiness  You can continue the ibuprofen 800 mg that you have at home--1 every 8 hours as needed for pain.
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
## 2023-08-09 ENCOUNTER — Ambulatory Visit (HOSPITAL_COMMUNITY)
Admission: EM | Admit: 2023-08-09 | Discharge: 2023-08-09 | Disposition: A | Discharge status: Home / Self Care | Attending: Emergency Medicine | Admitting: Emergency Medicine

## 2023-08-09 ENCOUNTER — Encounter (HOSPITAL_COMMUNITY): Payer: Self-pay | Admitting: *Deleted

## 2023-08-09 ENCOUNTER — Other Ambulatory Visit: Payer: Self-pay

## 2023-08-09 DIAGNOSIS — R051 Acute cough: Secondary | ICD-10-CM | POA: Diagnosis not present

## 2023-08-09 DIAGNOSIS — A084 Viral intestinal infection, unspecified: Secondary | ICD-10-CM | POA: Diagnosis not present

## 2023-08-09 LAB — POC COVID19/FLU A&B COMBO
Covid Antigen, POC: NEGATIVE
Influenza A Antigen, POC: NEGATIVE
Influenza B Antigen, POC: NEGATIVE

## 2023-08-09 MED ORDER — ONDANSETRON 4 MG PO TBDP
ORAL_TABLET | ORAL | Status: AC
  Filled 2023-08-09: qty 1

## 2023-08-09 MED ORDER — ONDANSETRON 4 MG PO TBDP
4.0000 mg | ORAL_TABLET | Freq: Three times a day (TID) | ORAL | 0 refills | Status: AC | PRN
Start: 2023-08-09 — End: ?

## 2023-08-09 MED ORDER — ONDANSETRON 4 MG PO TBDP
4.0000 mg | ORAL_TABLET | Freq: Once | ORAL | Status: AC
  Administered 2023-08-09: 4 mg via ORAL

## 2023-08-09 MED ORDER — PROMETHAZINE-DM 6.25-15 MG/5ML PO SYRP: 5.0000 mL | ORAL_SOLUTION | Freq: Four times a day (QID) | ORAL | 0 refills | Status: AC | PRN

## 2023-08-09 NOTE — Discharge Instructions (Addendum)
 Use the nausea medicine every 8 hours as needed.  Ensure you are staying well-hydrated with at least 64 ounces of water daily, if you are tolerating oral fluids you can progress to soup and broth.  If this goes well you can progress to the brat diet, otherwise known as bananas, rice, toast and applesauce.  This is a bland diet without any fried or spicy foods to help avoid upsetting your stomach any further.  You can use the cough medicine every 8 hours as needed, do not drink alcohol or drive on this medication as it may cause sedation.  Symptoms are viral in nature and should improve over the next few days.  If no improvement or any changes please return to clinic or follow-up with your primary care provider for reevaluation.
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
## 2023-08-09 NOTE — ED Triage Notes (Signed)
 PT reports he woke up feeling bad. Pt has diarrhea,vomiting body aches and can not taste anything.

## 2023-11-10 ENCOUNTER — Encounter (HOSPITAL_COMMUNITY): Payer: Self-pay

## 2023-11-10 ENCOUNTER — Emergency Department (HOSPITAL_COMMUNITY)
Admission: EM | Admit: 2023-11-10 | Discharge: 2023-11-10 | Disposition: A | Discharge status: Home / Self Care | Payer: Self-pay

## 2023-11-10 ENCOUNTER — Other Ambulatory Visit: Payer: Self-pay

## 2023-11-10 DIAGNOSIS — X58XXXA Exposure to other specified factors, initial encounter: Secondary | ICD-10-CM | POA: Insufficient documentation

## 2023-11-10 DIAGNOSIS — S86911A Strain of unspecified muscle(s) and tendon(s) at lower leg level, right leg, initial encounter: Secondary | ICD-10-CM | POA: Insufficient documentation

## 2023-11-10 MED ORDER — IBUPROFEN 600 MG PO TABS: 600.0000 mg | ORAL_TABLET | Freq: Four times a day (QID) | ORAL | 0 refills | Status: AC | PRN

## 2023-11-10 MED ORDER — TIZANIDINE HCL 4 MG PO TABS: 4.0000 mg | ORAL_TABLET | Freq: Three times a day (TID) | ORAL | 0 refills | Status: AC | PRN
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
## 2023-11-10 NOTE — ED Provider Notes (Signed)
 [place] EMERGENCY DEPARTMENT AT [facility] Provider Note   CSN: [id number] Arrival date & time: 11/10/23  1459     Patient presents with: Knee Pain   Micheal Schmidt is a 38 y.o. male.   The history is provided by the patient and medical records. No language interpreter was used.  Knee Pain Associated symptoms: no fever      38 year old male presenting for evaluation of right knee pain.  He was in class working on automotive today when he leaned forward to reach for something and felt a pop followed with severe pain to his right knee.  Pain however has improved but his instructor request for him to be evaluated prior to returning back to school.  At this time he states pain is minimal.  He noticed more pain when he fully extend his knee.  He denies any hip pain or ankle pain.  Denies any numbness.  No prior injury to the same knee  Prior to Admission medications   Medication Sig Start Date End Date Taking? Authorizing Provider  ondansetron  (ZOFRAN -ODT) 4 MG disintegrating tablet Take 1 tablet (4 mg total) by mouth every 8 (eight) hours as needed for nausea or vomiting. 08/09/23   Dreama, [place]  N, FNP  promethazine -dextromethorphan (PROMETHAZINE -DM) 6.25-15 MG/5ML syrup Take 5 mLs by mouth 4 (four) times daily as needed for cough. 08/09/23   Dreama, [place]  N, FNP  tiZANidine  (ZANAFLEX ) 4 MG tablet Take 1 tablet (4 mg total) by mouth every 8 (eight) hours as needed for muscle spasms. 07/10/23   Vonna Sharlet POUR, MD    Allergies: Bee pollen and Bee venom    Review of Systems  Constitutional:  Negative for fever.  Musculoskeletal:  Positive for arthralgias.  Skin:  Negative for wound.    Updated Vital Signs BP 123/85   Pulse 76   Temp 98.4 F (36.9 C) (Oral)   Resp 14   Ht 5' 9 (1.753 m)   Wt 70.3 kg   SpO2 100%   BMI 22.89 kg/m   Physical Exam Constitutional:      General: He is not in acute distress.    Appearance: He is well-developed.  HENT:      Head: Atraumatic.   Eyes:     Conjunctiva/sclera: Conjunctivae normal.    Musculoskeletal:        General: Tenderness (Right knee: Mild tenderness noted to suprapatellar region without any bruising no crepitus patella is located.  Able to flex and extend knee and able to ambulate.  No joint laxity) present.     Cervical back: Normal range of motion and neck supple.   Skin:    Findings: No rash.   Neurological:     Mental Status: He is alert.     (all labs ordered are listed, but only abnormal results are displayed) Labs Reviewed - No data to display  EKG: None  Radiology: No results found.   Procedures   Medications Ordered in the ED - No data to display                                  Medical Decision Making  BP 123/85   Pulse 76   Temp 98.4 F (36.9 C) (Oral)   Resp 14   Ht 5' 9 (1.753 m)   Wt 70.3 kg   SpO2 100%   BMI 22.89 kg/m   34:15 PM 38 year old male presenting for  evaluation of right knee pain.  He was in class working on automotive today when he leaned forward to reach for something and felt a pop followed with severe pain to his right knee.  Pain however has improved but his instructor request for him to be evaluated prior to returning back to school.  At this time he states pain is minimal.  He noticed more pain when he fully extend his knee.  He denies any hip pain or ankle pain.  Denies any numbness.  No prior injury to the same knee  Exam right knee overall reassuring patient able to flex and extend the knee no joint laxity patella is located.  X-ray considered but not performed as patient and I felt it is low yield.  RICE therapy discussed.  Suspect this is likely to be a strain of sprain of his knee and less likely to be fracture or dislocation.     Final diagnoses:  Knee strain, right, initial encounter    ED Discharge Orders          Ordered    ibuprofen  (ADVIL ) 600 MG tablet  Every 6 hours PRN        11/10/23 1643    tiZANidine   (ZANAFLEX ) 4 MG tablet  Every 8 hours PRN        11/10/23 1643               Nivia Colon, PA-C 11/10/23 1644    Ula Prentice SAUNDERS, MD 11/10/23 1700

## 2023-12-04 ENCOUNTER — Encounter (HOSPITAL_COMMUNITY): Payer: Self-pay | Admitting: Emergency Medicine

## 2023-12-04 ENCOUNTER — Emergency Department (HOSPITAL_COMMUNITY)
Admission: EM | Admit: 2023-12-04 | Discharge: 2023-12-05 | Discharge status: Against Medical Advice | Payer: Self-pay | Attending: Emergency Medicine | Admitting: Emergency Medicine

## 2023-12-04 DIAGNOSIS — Z5321 Procedure and treatment not carried out due to patient leaving prior to being seen by health care provider: Secondary | ICD-10-CM | POA: Insufficient documentation

## 2023-12-04 DIAGNOSIS — S90861A Insect bite (nonvenomous), right foot, initial encounter: Secondary | ICD-10-CM | POA: Insufficient documentation

## 2023-12-04 DIAGNOSIS — W57XXXA Bitten or stung by nonvenomous insect and other nonvenomous arthropods, initial encounter: Secondary | ICD-10-CM | POA: Insufficient documentation

## 2023-12-04 NOTE — ED Triage Notes (Signed)
 Pt stung by wasp or be on R foot. Localized swelling. Reports had allergy and epi pen as child. Denies throat or mouth swelling or difficulty breathing.

## 2024-05-09 ENCOUNTER — Encounter (HOSPITAL_COMMUNITY): Payer: Self-pay

## 2024-05-09 ENCOUNTER — Other Ambulatory Visit: Payer: Self-pay

## 2024-05-09 ENCOUNTER — Emergency Department (HOSPITAL_COMMUNITY)
Admission: EM | Admit: 2024-05-09 | Discharge: 2024-05-09 | Disposition: A | Discharge status: Home / Self Care | Payer: Self-pay | Attending: Emergency Medicine | Admitting: Emergency Medicine

## 2024-05-09 DIAGNOSIS — K209 Esophagitis, unspecified without bleeding: Secondary | ICD-10-CM | POA: Insufficient documentation

## 2024-05-09 LAB — BASIC METABOLIC PANEL WITH GFR
Anion gap: 11 (ref 5–15)
BUN: 12 mg/dL (ref 6–20)
CO2: 27 mmol/L (ref 22–32)
Calcium: 9.5 mg/dL (ref 8.9–10.3)
Chloride: 101 mmol/L (ref 98–111)
Creatinine, Ser: 1.09 mg/dL (ref 0.61–1.24)
GFR, Estimated: >60 mL/min
Glucose, Bld: 86 mg/dL (ref 70–99)
Potassium: 3.9 mmol/L (ref 3.5–5.1)
Sodium: 139 mmol/L (ref 135–145)

## 2024-05-09 LAB — CBC WITH DIFFERENTIAL/PLATELET
Abs Immature Granulocytes: 0.01 K/uL (ref 0.00–0.07)
Basophils Absolute: 0.1 K/uL (ref 0.0–0.1)
Basophils Relative: 1 %
Eosinophils Absolute: 0.1 K/uL (ref 0.0–0.5)
Eosinophils Relative: 1 %
HCT: 44.7 % (ref 39.0–52.0)
Hemoglobin: 15 g/dL (ref 13.0–17.0)
Immature Granulocytes: 0 %
Lymphocytes Relative: 41 %
Lymphs Abs: 3.1 K/uL (ref 0.7–4.0)
MCH: 30.5 pg (ref 26.0–34.0)
MCHC: 33.6 g/dL (ref 30.0–36.0)
MCV: 91 fL (ref 80.0–100.0)
Monocytes Absolute: 0.7 K/uL (ref 0.1–1.0)
Monocytes Relative: 9 %
Neutro Abs: 3.6 K/uL (ref 1.7–7.7)
Neutrophils Relative %: 48 %
Platelets: 203 K/uL (ref 150–400)
RBC: 4.91 MIL/uL (ref 4.22–5.81)
RDW: 14.4 % (ref 11.5–15.5)
WBC: 7.5 K/uL (ref 4.0–10.5)
nRBC: 0 % (ref 0.0–0.2)

## 2024-05-09 MED ORDER — GLUCAGON HCL RDNA (DIAGNOSTIC) 1 MG IJ SOLR
1.0000 mg | Freq: Once | INTRAMUSCULAR | Status: AC
  Administered 2024-05-09: 1 mg via INTRAVENOUS
  Filled 2024-05-09: qty 1

## 2024-05-09 NOTE — ED Triage Notes (Signed)
 Patient reports he was eating green beans and it got stuck. Patient is able to swallow saliva but not solid food, swallowing saliva causes significant pain. Airway is intact.

## 2024-05-09 NOTE — Discharge Instructions (Signed)
 It was a pleasure meeting with you today.  It is reassuring you were able to tolerate both food and fluids without any issue swallowing.  Please return if symptoms return or any other new symptoms develop.  Follow-up with your primary care as needed for ongoing evaluation.

## 2024-05-09 NOTE — ED Provider Notes (Signed)
 " [place] EMERGENCY DEPARTMENT AT [facility] Provider Note   CSN: [id number] Arrival date & time: 05/09/24  1516     Patient presents with: Food Bolus   Micheal Schmidt is a 38 y.o. male.   Patient is here for evaluation of food bolus/esophagitis.  Earlier today he was eating green beans while watching television when he felt as though something had gotten caught in his throat and felt sharp.  He has tried to make himself vomit without success.  He is able to speak in complete sentences without issue.  He is able to handle his own secretions and airway.  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  He goes on to say what scared him the most was when it initially happened he felt like he could not breathe and is now realizing that was likely more due to anxiety than whatever is in his throat.  He reports pain in his throat as a scratchy sensation.  The history is provided by the patient.       Prior to Admission medications  Medication Sig Start Date End Date Taking? Authorizing Provider  ibuprofen  (ADVIL ) 600 MG tablet Take 1 tablet (600 mg total) by mouth every 6 (six) hours as needed. 11/10/23   Nivia Colon, PA-C  ondansetron  (ZOFRAN -ODT) 4 MG disintegrating tablet Take 1 tablet (4 mg total) by mouth every 8 (eight) hours as needed for nausea or vomiting. 08/09/23   Dreama, [place]  N, FNP  promethazine -dextromethorphan (PROMETHAZINE -DM) 6.25-15 MG/5ML syrup Take 5 mLs by mouth 4 (four) times daily as needed for cough. 08/09/23   Dreama, [place]  N, FNP  tiZANidine  (ZANAFLEX ) 4 MG tablet Take 1 tablet (4 mg total) by mouth every 8 (eight) hours as needed for muscle spasms. 11/10/23   Nivia Colon, PA-C    Allergies: Bee pollen and Bee venom    Review of Systems  HENT:  Positive for sore throat.     Updated Vital Signs BP (!) 136/93 (BP Location: Right Arm)   Pulse (!)  102   Temp 98 F (36.7 C) (Oral)   Resp 16   Ht 5' 10 (1.778 m)   Wt 72.6 kg   SpO2 100%   BMI 22.96 kg/m   Physical Exam Vitals and nursing note reviewed.  Constitutional:      General: He is not in acute distress.    Appearance: Normal appearance. He is not ill-appearing, toxic-appearing or diaphoretic.     Comments: Able to speak in complete sentences and handle his own secretions and airway.  HENT:     Head: Normocephalic and atraumatic.     Nose: Nose normal.     Mouth/Throat:     Mouth: Mucous membranes are moist.     Pharynx: Oropharynx is clear. No oropharyngeal exudate or posterior oropharyngeal erythema.  Eyes:     General: No scleral icterus.    Extraocular Movements: Extraocular movements intact.     Conjunctiva/sclera: Conjunctivae normal.  Cardiovascular:     Rate and Rhythm: Normal rate and regular rhythm.  Pulmonary:     Effort: Pulmonary effort is normal. No respiratory distress.  Musculoskeletal:        General: Normal range of motion.     Cervical back: Normal range of motion and neck supple. No rigidity or tenderness.  Skin:    General: Skin is warm and dry.  Coloration: Skin is not jaundiced or pale.  Neurological:     Mental Status: He is alert and oriented to person, place, and time.     (all labs ordered are listed, but only abnormal results are displayed) Labs Reviewed  CBC WITH DIFFERENTIAL/PLATELET  BASIC METABOLIC PANEL WITH GFR    EKG: None  Radiology: No results found.   Procedures   Medications Ordered in the ED  glucagon  (human recombinant) (GLUCAGEN ) injection 1 mg (1 mg Intravenous Given 05/09/24 1627)    Patient presents to the ED for concern of food bolus, this involves an extensive number of treatment options, and is a complaint that carries with it a high risk of complications and morbidity.  The differential diagnosis includes esophageal obstruction, esophagitis.   Lab Tests:  I Ordered, and personally  interpreted labs.  The pertinent results include: Lab work is unremarkable.   Medicines ordered and prescription drug management:  I ordered medication including glucagon  for relaxation of gastrointestinal smooth muscle. Reevaluation of the patient after these medicines showed that the patient improved   Problem List / ED Course:     Food bolus/esophagitis.  Patient is able to speak in complete sentences, and handle secretions and airway without difficulty.  Given IV glucagon .  Able to tolerate crackers and ginger ale without issue.  He reports complete resolution of symptoms.  Return precautions discussed and patient verbalized understanding.  Stable for discharge.   Reevaluation:  After the interventions noted above, I reevaluated the patient and found that they have :resolved   Dispostion:  After consideration of the diagnostic results and the patients response to treatment, I feel that the patent would benefit from supportive care in the home setting and follow-up with primary care as needed.  Return precautions given.   Medical Decision Making Amount and/or Complexity of Data Reviewed Labs: ordered.  Risk Prescription drug management.   This note was produced using [employer]. While the provider has reviewed and verified all clinical information, transcription errors may remain.    Final diagnoses:  Esophagitis    ED Discharge Orders     None          Rosina Almarie LABOR, PA-C 05/10/24 0103    Pamella Ozell LABOR, DO 05/12/24 2012  "
